# Patient Record
Sex: Female | Born: 1985 | Race: Black or African American | Hispanic: No | Marital: Single | Smoking: Former smoker
Health system: Southern US, Community
[De-identification: ages and names within clinical notes are randomized; demographics above are authoritative.]

## PROBLEM LIST (undated history)

## (undated) ENCOUNTER — Inpatient Hospital Stay (HOSPITAL_COMMUNITY): Payer: Self-pay

## (undated) DIAGNOSIS — D649 Anemia, unspecified: Secondary | ICD-10-CM

## (undated) HISTORY — DX: Anemia, unspecified: D64.9

---
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
## 2006-09-05 ENCOUNTER — Ambulatory Visit: Payer: Self-pay | Admitting: Family Medicine

## 2007-02-01 ENCOUNTER — Inpatient Hospital Stay: Payer: Self-pay | Admitting: Obstetrics and Gynecology

## 2007-03-08 ENCOUNTER — Emergency Department: Payer: Self-pay | Admitting: Emergency Medicine

## 2007-08-08 ENCOUNTER — Emergency Department (HOSPITAL_COMMUNITY): Admission: EM | Admit: 2007-08-08 | Discharge: 2007-08-08 | Payer: Self-pay | Admitting: Emergency Medicine

## 2009-04-07 ENCOUNTER — Emergency Department (HOSPITAL_COMMUNITY): Admission: EM | Admit: 2009-04-07 | Discharge: 2009-04-08 | Payer: Self-pay | Admitting: Emergency Medicine

## 2010-03-04 ENCOUNTER — Inpatient Hospital Stay (HOSPITAL_COMMUNITY): Admission: AD | Admit: 2010-03-04 | Discharge: 2010-03-04 | Payer: Self-pay | Admitting: Obstetrics & Gynecology

## 2010-03-04 ENCOUNTER — Ambulatory Visit: Payer: Self-pay | Admitting: Obstetrics & Gynecology

## 2010-09-03 ENCOUNTER — Inpatient Hospital Stay (HOSPITAL_COMMUNITY)
Admission: AD | Admit: 2010-09-03 | Discharge: 2010-09-03 | Disposition: A | Discharge status: Home / Self Care | Payer: Managed Care, Other (non HMO) | Source: Ambulatory Visit | Attending: Obstetrics and Gynecology | Admitting: Obstetrics and Gynecology

## 2010-09-03 DIAGNOSIS — O479 False labor, unspecified: Secondary | ICD-10-CM | POA: Insufficient documentation

## 2010-09-03 LAB — WET PREP, GENITAL
Clue Cells Wet Prep HPF POC: NONE SEEN
Trich, Wet Prep: NONE SEEN

## 2010-09-06 ENCOUNTER — Encounter (HOSPITAL_COMMUNITY)
Admission: RE | Admit: 2010-09-06 | Discharge: 2010-09-06 | Disposition: A | Discharge status: Home / Self Care | Payer: Managed Care, Other (non HMO) | Source: Ambulatory Visit | Attending: Obstetrics and Gynecology | Admitting: Obstetrics and Gynecology

## 2010-09-06 DIAGNOSIS — Z01812 Encounter for preprocedural laboratory examination: Secondary | ICD-10-CM | POA: Insufficient documentation

## 2010-09-06 LAB — CBC
MCH: 26 pg (ref 26.0–34.0)
MCHC: 32.4 g/dL (ref 30.0–36.0)
MCV: 80.2 fL (ref 78.0–100.0)
RBC: 4.19 MIL/uL (ref 3.87–5.11)
WBC: 5.8 10*3/uL (ref 4.0–10.5)

## 2010-09-06 LAB — RPR: RPR Ser Ql: NONREACTIVE

## 2010-09-13 ENCOUNTER — Inpatient Hospital Stay (HOSPITAL_COMMUNITY)
Admission: RE | Admit: 2010-09-13 | Discharge: 2010-09-16 | DRG: 765 | Disposition: A | Discharge status: Home / Self Care | Payer: Managed Care, Other (non HMO) | Source: Ambulatory Visit | Attending: Obstetrics and Gynecology | Admitting: Obstetrics and Gynecology

## 2010-09-13 ENCOUNTER — Other Ambulatory Visit: Payer: Self-pay | Admitting: Obstetrics and Gynecology

## 2010-09-13 DIAGNOSIS — O9903 Anemia complicating the puerperium: Secondary | ICD-10-CM | POA: Diagnosis not present

## 2010-09-13 DIAGNOSIS — D649 Anemia, unspecified: Secondary | ICD-10-CM | POA: Diagnosis not present

## 2010-09-13 DIAGNOSIS — O34219 Maternal care for unspecified type scar from previous cesarean delivery: Principal | ICD-10-CM | POA: Diagnosis present

## 2010-09-13 DIAGNOSIS — D689 Coagulation defect, unspecified: Secondary | ICD-10-CM | POA: Diagnosis not present

## 2010-09-13 DIAGNOSIS — D696 Thrombocytopenia, unspecified: Secondary | ICD-10-CM | POA: Diagnosis not present

## 2010-09-13 LAB — ABO/RH: ABO/RH(D): O POS

## 2010-09-13 LAB — TYPE AND SCREEN: ABO/RH(D): O POS

## 2010-09-14 LAB — CBC
HCT: 29.4 % — ABNORMAL LOW (ref 36.0–46.0)
Hemoglobin: 9.6 g/dL — ABNORMAL LOW (ref 12.0–15.0)
MCH: 26.4 pg (ref 26.0–34.0)
MCV: 80.8 fL (ref 78.0–100.0)
WBC: 6.4 10*3/uL (ref 4.0–10.5)

## 2010-09-15 LAB — CBC
Hemoglobin: 9 g/dL — ABNORMAL LOW (ref 12.0–15.0)
MCHC: 31.7 g/dL (ref 30.0–36.0)
Platelets: 133 10*3/uL — ABNORMAL LOW (ref 150–400)
RBC: 3.48 MIL/uL — ABNORMAL LOW (ref 3.87–5.11)

## 2010-09-15 LAB — DIFFERENTIAL
Basophils Absolute: 0 10*3/uL (ref 0.0–0.1)
Eosinophils Absolute: 0.1 10*3/uL (ref 0.0–0.7)
Eosinophils Relative: 1 % (ref 0–5)
Lymphs Abs: 1.6 10*3/uL (ref 0.7–4.0)
Monocytes Absolute: 0.4 10*3/uL (ref 0.1–1.0)
Monocytes Relative: 7 % (ref 3–12)

## 2010-09-15 NOTE — Op Note (Signed)
NAMEBLANCH, STANG NO.:  [id number]  MEDICAL RECORD NO.:  [id number]           PATIENT TYPE:  I  LOCATION:  9106                          [facility]ACILITY:  [facility]  PHYSICIAN:  Osborn Coho, M.D.   DATE OF BIRTH:  1986-03-29  DATE OF PROCEDURE:  09/13/2010 DATE OF DISCHARGE:                              OPERATIVE REPORT   PREOPERATIVE DIAGNOSES: 1. A 39-2/7th weeks. 2. Repeat cesarean section.  POSTOPERATIVE DIAGNOSES: 1. A 39-2/7th weeks. 2. Repeat cesarean section.  PROCEDURE:  Repeat C-section.  ATTENDING PHYSICIAN:  Osborn Coho, MD  ASSISTANT:  Denny Levy, CNM  ANESTHESIA:  Spinal.  FINDINGS:  Live female infant with Apgars of 9 at 1 minute and 9 at 5 minutes, weighing 6 pounds and 13 ounces.  SPECIMEN TO PATHOLOGY:  Placenta.  FLUIDS:  2300 mL.  URINE OUTPUT:  150 mL.  EBL:  700 mL.  COMPLICATIONS:  None.  PROCEDURE:  The patient was taken to the operating room after the risks, benefits and alternatives were discussed with the patient.  The patient verbalized understanding and consent signed and witnessed.  The patient was given a spinal per Anesthesia and prepped and draped in the normal sterile fashion in a supine position with a leftward tilt.  A Pfannenstiel skin incision was made and carried down to the underlying layer of fascia with the scalpel and the Bovie.  The fascia was excised bilaterally in the midline and extended bilaterally with the Mayo scissors.  Kocher clamps were placed on the inferior aspect of the fascial incision and the rectus muscle was excised from the fascia.  The same was done on the superior aspect of the fascial incision.  The muscle was separated in the midline and the peritoneum entered incidentally at the time of removing the rectus muscle superiorly from the fascia.  The peritoneum was extended manually and the Alexis self retaining retractor was placed.  The bladder flap was created and a very thin  lower uterine segment was noted.  The uterine incision was made and extended bilaterally with the bandage scissors.  The membranes were ruptured and copious clear fluid was noted and the infant was delivered in the vertex presentation, somewhat compound with the right hand presenting as well and a loose nuchal cord.  After the cord was clamped and cut, the infant was handed to the waiting pediatricians.  The placenta was removed via fundal massage and the uterus was cleared of all clots and debris.  The uterine incision was repaired with 0 Vicryl via a running interlocking stitch and a second imbricating layer was performed.  Bilateral ovaries and fallopian tubes were appeared to be within normal limits and intra-abdominal cavity was copiously irrigated. The Alexis retractor was removed and the peritoneum was repaired with 2- 0 chromic via running stitch.  The fascia was repaired with 0 Vicryl via a running stitch.  The subcutaneous tissue was irrigated and needle made hemostatic with the Bovie and reapproximated using three interrupted stitches of 2-0 plain.  The skin was reapproximated using 3-0 Monocryl via a subcuticular stitch and 0.5-inch Steri-Strips with Benzoin was applied.  A pressure dressing was applied as well.  Sponge, lap and needle count was correct.  The patient tolerated the procedure well and was awaiting transfer to the recovery room in good condition.     Osborn Coho, M.D.     AR/MEDQ  D:  09/13/2010  T:  09/13/2010  Job:  [id number]  Electronically Signed by Osborn Coho M.D. on 09/15/2010 09:33:21 AM

## 2010-09-17 NOTE — Discharge Summary (Addendum)
NAMEBRIDGETTE, Macias NO.:  [id number]  MEDICAL RECORD NO.:  [id number]           PATIENT TYPE:  LOCATION:                                 FACILITY:  PHYSICIAN:  Hal Morales, M.D.DATE OF BIRTH:  Aug 14, 1985  DATE OF ADMISSION:  09/13/2010 DATE OF DISCHARGE:  09/16/2010                              DISCHARGE SUMMARY   ADMITTING DIAGNOSES: 1. Intrauterine pregnancy at 39-2/7 weeks. 2. Previous cesarean section with desire for repeat. 3. Group B strep positive. 4. Methicillin-resistant Staphylococcus aureus screen positive  DISCHARGE DIAGNOSES: 1. 38-2/7 weeks. 2. Repeat cesarean section. 3. Swelling of the parotid glands  PROCEDURES: 1. Repeat low transverse cesarean section. 2. Spinal anesthesia.  HOSPITAL COURSE:  Cindy Macias is a 25 year old gravida 3, para 2-0-0-2 at 39-2/7 weeks who presented on the day of admission on February 15 for scheduled repeat cesarean section.  Her pregnancy had been remarkable for: 1. Previous C-section x2. 2. Anemia. 3. Chlamydia first trimester. 4. History of postpartum depression. 5. __________. 6. History of previous sexually transmitted infections. 7. Second trimester urinary tract infection. 8. Rubella 9. The patient is a twin.  The patient was taken to the operating room where Dr. Su Hilt performed a repeat low transverse cesarean section.  Findings were a viable female, weight 6 pounds 13 ounces, Apgar's were 9 and 9.  Infant was taken to the full-term nursery, the patient was taken to recovery in good condition.  By postop day #1, the patient was overall doing well. However, she had sudden onset of swelling of her parotid glands around 2:30 p.m.  Her hemoglobin that day had been 9.6 down from 10.9, white blood cell count was 6.4, platelet count was 128 down from 144.  She had a little bit of tympanic distention of her abdomen, but had bowel sounds in all quadrants.  Lochia was scant.  Fundus was firm.   Extremities were within normal limits.  Orthostatics were stable.  She had already been placed on iron.  She had some Percocet and Benadryl prior to the onset of these symptoms, but she had Percocet before.  She had no shortness of breath, fever, chest pain, respiratory symptoms, or difficulty swallowing, and her O2 sat was normal.  The bilateral swelling was noted to be equal on both sides.  The patient did have a history of receiving an MMR vaccine in the past.  Her throat was clear.  Dr. Normand Sloop was consulted.  We  continued with Benadryl and had an EpiPen at the bedside.  Dr. Normand Sloop consulted Dr. Suszanne Conners as an ENT.  He recommended that IV hydration be continued, massage of the parotid gland be implemented, and the patient was to supplement with cough drops.  He felt that it was related to dehydration and blockage of the parotid gland versus an infectious process.  Over the next 2 days, the patient remained stable, although she still had the bilateral parotid swelling.  Vital signs remained stable.  Hemoglobin was repeated on February 17 with result of 9.0, normal neutrophil count, white blood cell count was 5.7, and platelet count was 133,000.  She was  up ad lib without difficulty.  She was breast-feeding.  Dr. Pennie Rushing saw the patient on February 17.  By February 18, the patient was doing well, she felt that parotid swelling was less, did seem to be approximately one third reduced on visual exam by this midwife.  She was undecided about birth control.  Infant had been under phototherapy since day 1, but bilirubin was coming down.  The patient's vital signs were stable.  Her incision was clean, dry, and intact.  She was deemed to receive full benefit of hospital stay and was discharged home in stable condition.  Discharge instructions per [facility] handout.  DISCHARGE MEDICATIONS:  Motrin 600 mg p.o. q.6 h. p.r.n. pain, Percocet 5/325, 1-2 p.o. q.24 hours p.r.n.  pain.  Discharge followup will occur at [facility] in 6 weeks or p.r.n.  The office will also schedule followup appointment with ENT, Dr. Suszanne Conners within the next week.     Cindy Macias, C.N.M.   ______________________________ Hal Morales, M.D.    VLL/MEDQ  D:  09/16/2010  T:  09/16/2010  Job:  [id number]  Electronically Signed by Nigel Bridgeman C.N.M. on 10/10/2010 04:52:06 PM Electronically Signed by Dierdre Forth M.D. on 10/14/2010 07:19:15 PM

## 2010-09-19 ENCOUNTER — Inpatient Hospital Stay (HOSPITAL_COMMUNITY)
Admission: AD | Admit: 2010-09-19 | Discharge: 2010-09-23 | DRG: 776 | Disposition: A | Discharge status: Home / Self Care | Payer: Managed Care, Other (non HMO) | Source: Ambulatory Visit | Attending: Obstetrics and Gynecology | Admitting: Obstetrics and Gynecology

## 2010-09-19 DIAGNOSIS — O135 Gestational [pregnancy-induced] hypertension without significant proteinuria, complicating the puerperium: Principal | ICD-10-CM | POA: Diagnosis present

## 2010-09-19 LAB — COMPREHENSIVE METABOLIC PANEL
AST: 111 U/L — ABNORMAL HIGH (ref 0–37)
Albumin: 2.7 g/dL — ABNORMAL LOW (ref 3.5–5.2)
CO2: 24 mEq/L (ref 19–32)
Calcium: 8.8 mg/dL (ref 8.4–10.5)
Chloride: 107 mEq/L (ref 96–112)
Creatinine, Ser: 1.13 mg/dL (ref 0.4–1.2)
GFR calc Af Amer: >60 mL/min (ref >60)
Glucose, Bld: 100 mg/dL — ABNORMAL HIGH (ref 70–99)
Potassium: 3.4 mEq/L — ABNORMAL LOW (ref 3.5–5.1)
Sodium: 138 mEq/L (ref 135–145)
Total Protein: 6.2 g/dL (ref 6.0–8.3)

## 2010-09-19 LAB — URINE MICROSCOPIC-ADD ON

## 2010-09-19 LAB — URINALYSIS, ROUTINE W REFLEX MICROSCOPIC
Bilirubin Urine: NEGATIVE
Nitrite: NEGATIVE
Protein, ur: NEGATIVE mg/dL
Urobilinogen, UA: 0.2 mg/dL (ref 0.0–1.0)
pH: 6 (ref 5.0–8.0)

## 2010-09-19 LAB — LACTATE DEHYDROGENASE: LDH: 274 U/L — ABNORMAL HIGH (ref 94–250)

## 2010-09-19 LAB — CBC
Hemoglobin: 9.9 g/dL — ABNORMAL LOW (ref 12.0–15.0)
MCV: 80.8 fL (ref 78.0–100.0)
Platelets: 190 10*3/uL (ref 150–400)

## 2010-09-20 LAB — CBC
MCH: 26 pg (ref 26.0–34.0)
MCV: 80.9 fL (ref 78.0–100.0)
Platelets: 176 10*3/uL (ref 150–400)
RBC: 3.62 MIL/uL — ABNORMAL LOW (ref 3.87–5.11)
RDW: 14.8 % (ref 11.5–15.5)

## 2010-09-20 LAB — COMPREHENSIVE METABOLIC PANEL
AST: 76 U/L — ABNORMAL HIGH (ref 0–37)
Albumin: 2.6 g/dL — ABNORMAL LOW (ref 3.5–5.2)
BUN: 9 mg/dL (ref 6–23)
Calcium: 8.5 mg/dL (ref 8.4–10.5)
Creatinine, Ser: 1.03 mg/dL (ref 0.4–1.2)
GFR calc Af Amer: >60 mL/min (ref >60)
Total Protein: 5.5 g/dL — ABNORMAL LOW (ref 6.0–8.3)

## 2010-09-20 LAB — URIC ACID: Uric Acid, Serum: 9.4 mg/dL — ABNORMAL HIGH (ref 2.4–7.0)

## 2010-09-21 LAB — COMPREHENSIVE METABOLIC PANEL
ALT: 101 U/L — ABNORMAL HIGH (ref 0–35)
AST: 40 U/L — ABNORMAL HIGH (ref 0–37)
CO2: 25 mEq/L (ref 19–32)
Chloride: 101 mEq/L (ref 96–112)
Creatinine, Ser: 0.91 mg/dL (ref 0.4–1.2)
GFR calc Af Amer: >60 mL/min (ref >60)
GFR calc non Af Amer: >60 mL/min (ref >60)
Glucose, Bld: 91 mg/dL (ref 70–99)
Total Bilirubin: 0.4 mg/dL (ref 0.3–1.2)

## 2010-09-21 LAB — URINE CULTURE
Colony Count: 85000
Culture  Setup Time: 201202220148

## 2010-09-21 LAB — DIFFERENTIAL
Basophils Relative: 0 % (ref 0–1)
Lymphocytes Relative: 26 % (ref 12–46)
Lymphs Abs: 1.3 10*3/uL (ref 0.7–4.0)
Monocytes Absolute: 0.5 10*3/uL (ref 0.1–1.0)
Monocytes Relative: 10 % (ref 3–12)
Neutro Abs: 3.2 10*3/uL (ref 1.7–7.7)
Neutrophils Relative %: 62 % (ref 43–77)

## 2010-09-21 LAB — CREATININE CLEARANCE, URINE, 24 HOUR
Collection Interval-CRCL: 24 hours
Creatinine, 24H Ur: 1830 mg/d — ABNORMAL HIGH (ref 700–1800)
Creatinine, Urine: 30.5 mg/dL
Creatinine: 1.03 mg/dL (ref 0.4–1.2)
Urine Total Volume-CRCL: 6000 mL

## 2010-09-21 LAB — CBC
HCT: 31.7 % — ABNORMAL LOW (ref 36.0–46.0)
Hemoglobin: 10.1 g/dL — ABNORMAL LOW (ref 12.0–15.0)
MCH: 26 pg (ref 26.0–34.0)
RBC: 3.89 MIL/uL (ref 3.87–5.11)

## 2010-09-21 LAB — LACTATE DEHYDROGENASE: LDH: 244 U/L (ref 94–250)

## 2010-09-21 LAB — PROTEIN, URINE, 24 HOUR: Collection Interval-UPROT: 24 hours

## 2010-09-22 LAB — COMPREHENSIVE METABOLIC PANEL
AST: 27 U/L (ref 0–37)
Albumin: 2.7 g/dL — ABNORMAL LOW (ref 3.5–5.2)
Alkaline Phosphatase: 88 U/L (ref 39–117)
Chloride: 105 mEq/L (ref 96–112)
Creatinine, Ser: 0.96 mg/dL (ref 0.4–1.2)
GFR calc Af Amer: >60 mL/min (ref >60)
Potassium: 3.8 mEq/L (ref 3.5–5.1)
Sodium: 136 mEq/L (ref 135–145)
Total Bilirubin: 0.4 mg/dL (ref 0.3–1.2)

## 2010-09-22 LAB — DIFFERENTIAL
Basophils Absolute: 0 10*3/uL (ref 0.0–0.1)
Basophils Relative: 1 % (ref 0–1)
Eosinophils Absolute: 0.1 10*3/uL (ref 0.0–0.7)
Neutrophils Relative %: 56 % (ref 43–77)

## 2010-09-22 LAB — CBC
Platelets: 201 10*3/uL (ref 150–400)
RBC: 3.84 MIL/uL — ABNORMAL LOW (ref 3.87–5.11)
WBC: 4.1 10*3/uL (ref 4.0–10.5)

## 2010-09-22 LAB — URIC ACID: Uric Acid, Serum: 7.8 mg/dL — ABNORMAL HIGH (ref 2.4–7.0)

## 2010-09-22 LAB — HEPATITIS B SURFACE ANTIGEN: Hepatitis B Surface Ag: NEGATIVE

## 2010-09-23 LAB — CBC
HCT: 31 % — ABNORMAL LOW (ref 36.0–46.0)
Hemoglobin: 9.6 g/dL — ABNORMAL LOW (ref 12.0–15.0)
MCHC: 31 g/dL (ref 30.0–36.0)

## 2010-09-23 LAB — COMPREHENSIVE METABOLIC PANEL
ALT: 58 U/L — ABNORMAL HIGH (ref 0–35)
Alkaline Phosphatase: 82 U/L (ref 39–117)
CO2: 24 mEq/L (ref 19–32)
Chloride: 107 mEq/L (ref 96–112)
GFR calc non Af Amer: >60 mL/min (ref >60)
Glucose, Bld: 88 mg/dL (ref 70–99)
Potassium: 3.8 mEq/L (ref 3.5–5.1)
Sodium: 137 mEq/L (ref 135–145)
Total Bilirubin: 0.1 mg/dL — ABNORMAL LOW (ref 0.3–1.2)

## 2010-09-23 LAB — DIFFERENTIAL
Basophils Absolute: 0 10*3/uL (ref 0.0–0.1)
Lymphocytes Relative: 38 % (ref 12–46)
Monocytes Absolute: 0.4 10*3/uL (ref 0.1–1.0)
Neutro Abs: 2.2 10*3/uL (ref 1.7–7.7)

## 2010-09-23 LAB — LACTATE DEHYDROGENASE: LDH: 186 U/L (ref 94–250)

## 2010-09-23 LAB — MAGNESIUM: Magnesium: 1.8 mg/dL (ref 1.5–2.5)

## 2010-09-25 LAB — HEPATITIS C VRS RNA DETECT BY PCR-QUAL: Hepatitis C Vrs RNA by PCR-Qual: NEGATIVE

## 2010-10-13 LAB — URINALYSIS, ROUTINE W REFLEX MICROSCOPIC
Glucose, UA: NEGATIVE mg/dL
Hgb urine dipstick: NEGATIVE
Protein, ur: NEGATIVE mg/dL
pH: 5.5 (ref 5.0–8.0)

## 2010-10-13 LAB — CBC
HCT: 34.6 % — ABNORMAL LOW (ref 36.0–46.0)
Hemoglobin: 11.6 g/dL — ABNORMAL LOW (ref 12.0–15.0)
MCHC: 33.5 g/dL (ref 30.0–36.0)
RBC: 4.11 MIL/uL (ref 3.87–5.11)

## 2010-10-13 LAB — GC/CHLAMYDIA PROBE AMP, GENITAL
Chlamydia, DNA Probe: POSITIVE — AB
GC Probe Amp, Genital: NEGATIVE

## 2010-10-13 LAB — WET PREP, GENITAL

## 2010-10-26 NOTE — H&P (Signed)
  NAME:  Cindy Macias, ALIOTO NO.:  [id number]  MEDICAL RECORD NO.:  [id number]           PATIENT TYPE:  I  LOCATION:  9307                          [facility]  [facility]  PHYSICIAN:  Osborn Coho, M.D.   DATE OF BIRTH:  25-Feb-1986  DATE OF ADMISSION:  09/19/2010 DATE OF DISCHARGE:                             HISTORY & PHYSICAL   The patient is a 25 year old, gravida 2, para 2-0-0-2, 6 days postop low transverse C-section, who presents to maternity admissions after discovering that she had elevated blood pressure at her child's pediatrician's office today.  She also reports not feeling well for the past several days.  She denies epigastric pain, vision changes or headache, but reports nausea and occasional vomiting since delivery. She did not have any issues with elevated blood pressures during the pregnancy or any medium intermediate postpartum period.  She reports that her edema is stable since delivery and reports good urine output.  OBJECTIVE:  VITAL SIGNS:  Blood pressures 130s/60s-80s, afebrile.  Vital signs otherwise stable. HEART:  Regular rate and rhythm. LUNGS:  Clear to auscultation bilaterally. ABDOMEN:  Soft and nontender.  Fundus firm one above ST.  Incision clean, dry and intact. EXTREMITIES:  Edema 2+.  Negative Homans.  Deep tendon reflexes 1+.  No clonus.  LABORATORY DATA:  CBC shows white blood cell count of 5.8, hemoglobin of 9.9, hematocrit 30.7, platelet count of 190, this is up from her postpartum platelet counts in the 130s.  SGOT elevated at 111, SGPT elevated at 162, LDH elevated at 274 and uric acid elevated at 9.8. Clean catch UA is negative for protein.  ASSESSMENT:  Postpartum gestational hypertension versus preeclampsia.  PLAN:  Admit to third floor per consultation with Dr. Su Hilt for a 24- hour urine Foley catheter.  PIH labs in the morning.     Dorathy Kinsman, CNM   ______________________________ Osborn Coho,  M.D.    VS/MEDQ  D:  09/20/2010  T:  09/20/2010  Job:  [id number]  Electronically Signed by Dorathy Kinsman  on 09/24/2010 09:55:05 PM Electronically Signed by Osborn Coho M.D. on 10/26/2010 09:49:33 AM

## 2010-11-01 NOTE — Discharge Summary (Signed)
NAMEPENNELOPE, Cindy Macias NO.:  [id number]  MEDICAL RECORD NO.:  [id number]           PATIENT TYPE:  I  LOCATION:  9307                          [facility]  PHYSICIAN:  Janine Limbo, M.D.DATE OF BIRTH:  [birth year]  DATE OF ADMISSION:  09/19/2010 DATE OF DISCHARGE:  09/23/2010                              DISCHARGE SUMMARY   ADMITTING DIAGNOSES: 1. Six days postpartum, status post primary low transverse cesarean     section for delivery. 2. Postpartum hypertension (rule out preeclampsia). 3. Elevated LFTs and uric acid. 4. Persistent anemia. 5. Lactating.  DISCHARGE DIAGNOSES: 1. Postpartum and postoperative day number 10. 2. LFTs and uric acid and decreasing (ALT and uric acid still slightly     high at the time of discharge). 3. Trichomonas on urinalysis. 4. Lactating. 5. Postpartum hypertension with no evidence of preeclampsia. 6. Persisting anemia.  HOSPITAL PROCEDURES:  AICU admission and 24-hour magnesium sulfate therapy while awaiting 24-hour urine results.  HOSPITAL COURSE:  Ms. Sobiech is a 25 year old para 2-0-0-2 who presented on the date of admission 6 days postop, status post a low transverse cesarean section with chief complaint of noting elevated blood pressures while at her child's pediatrician that day.  She reported not feeling well for few days prior to that.  She denied any epigastric pain, visual changes, or headache on admission; however, she had reported some nausea, and occasional vomiting since delivery.  She did not have any issues with elevated blood pressures either during her pregnancy nor in the immediate postpartum period after her surgery.  She reported that her edema had been stable since her delivery and reported good urinary output.  She had blood pressures initially 130 systolics over diastolics of 60-80s.  No clonus on admission.  Deep tendon reflexes were 1+.  She had negative Homans sign, and she had 2+ edema  bilateral lower extremities.  Fundus was firm.  Incision was clean, dry, and intact.  Physical exam was otherwise within normal limits.  She did have daily PIH labs while hospitalized starting on September 19, 2010, and ending on September 23, 2010.  White count was normal throughout. Hemoglobin on admission was 9.9.  on day of discharge, it was 9.6, platelets were stable throughout.  On admission they were 190 and during the course of her stay they ranged from a low of 176 to 201 but all this within normal limits.  Her urinalysis, she did not have any protein. She had moderate hemoglobin & 15 of ketones, & moderate Luk's and it also did show trichomoniasis.  She did have a MRSA screen which was negative.  The urine culture for her UA did show 85,000 colonies multiple morphocites including group beta strep, and she also had positive cultures on OB, urine antenatally.  On admission, liver enzymes were elevated.  Her AST was 111, ALT was 162, LDH was 274, and her uric acid was 9.8.  She had reported when she called stating that blood pressure was up at the pediatrician & that her blood pressure there was 164/90.  She was initially admitted to the third floor for the  24-hour urine collection, the Foley catheter and on the morning of September 20, 2010, just before lunch time Dr. Normand Sloop did transfer the patient to AICU to start magnesium infusion while awaiting her 24-hour urine result.  On the morning of September 20, 2010, the patient was without complaints.  She was doing well.  She was pumping, father of the baby did have the infant at bedside.  Her blood pressure range that day was 139-149/91 to 95.  Physical exam was within normal limits.  Her liver enzymes on September 20, 2010, AST had come down to 76, ALT was down to 132.  LDH was down slightly to 265.  Uric acid was coming down and it was 9.4.  The magnesium sulfate was discontinued on September 21, 2010, around lunch time.  Her 24-hour  urine did come back within normal limits.  Total urinary protein for the 24 hours was 180.  Her creatinine clearance was just slightly high above normal at 123.  Her calcium was slightly low while she is on the magnesium, it got to low of 7.6 and on the date of discharge it had come back up to within normal limits at 8.7.  She was transferred to the floor on the afternoon of September 21, 2010.  Labs and blood pressures continued to stabilize.  Around 3:30 p.m. on September 21, 2010, her blood pressure was 134/87.  She did have adequate urinary output.  Secondary to the increased liver enzymes Dr. Pennie Rushing did order for her to have both hepatitis A, B, and C antibodies drawn.  Those were all negative, and those were done on September 21, 2010.  After being transferred to the floor, on September 22, 2010, her blood pressures had started trending upward since off the magnesium, highest noted on the morning of a September 22, 2010, was 157/95 the range since on the floor had been 131-157 over diastolics of 87-96. Later on September 22, 2010, Dr. Stefano Gaul did evaluate the patient and did start the patient on labetalol 200 mg p.o. b.i.d.  They continued checking her PIH labs as I mentioned daily.  On September 21, 2010, AST had gone down to 40, ALT 101, LDH 244.  Uric acid down to 8.5.  On the morning of September 23, 2010, AST was back to normal equal to 21, ALT was still slightly high, it was equal to 58.  LDH was normal at 186. Uric acid was to still slightly high at 7.8.  On the morning of September 23, 2010, blood pressures ranged 115-139 over diastolics of 57-84.  The patient was deemed to have received full benefit of her hospital stay and was discharged home in stable condition by Dr. Stefano Gaul.  On September 23, 2010, which would have been postoperative day number 10.  DISCHARGE MEDICATIONS: 1. Ibuprofen 800 mg one tablet p.o. q.8 h p.r.n. pain. 2. Percocet 5/325  one tablet p.o. q.3 h or 2  tablets p.o. q.6 h     p.r.n. moderate to severe pain. 3. Prenatal vitamin 1 tablet p.o. daily. 4. Labetalol 200 mg one tablet p.o. b.i.d.  DISCHARGE FOLLOWUP:  To my knowledge, the patient was to either have a 1- week followup at the office or have a Smart Start blood pressure check in 1 week, and the patient was given PIH precautions and continued postoperative precautions as per her discharge instructions when she had been discharged previously.     Candice Denny Levy, CNM   ______________________________ Janine Limbo, M.D.  CHS/MEDQ  D:  10/18/2010  T:  10/19/2010  Job:  [id number]  Electronically Signed by Carolanne Grumbling CNM on 10/19/2010 11:11:56 AM Electronically Signed by Kirkland Hun M.D. on 11/01/2010 11:03:48 AM

## 2010-11-03 LAB — RAPID STREP SCREEN (MED CTR MEBANE ONLY): Streptococcus, Group A Screen (Direct): NEGATIVE

## 2010-12-15 NOTE — Discharge Summary (Signed)
NAME:  Cindy Macias, Cindy Macias                         ACCOUNT NO.:  [id number]   MEDICAL RECORD NO.:  [id number]                   PATIENT TYPE:  INP   LOCATION:  9124                                 [facility]ILITY:  [facility]   PHYSICIAN:  Ginger Carne, MD               DATE OF BIRTH:  Jun 26, 1986   DATE OF ADMISSION:  02/24/2004  DATE OF DISCHARGE:  02/27/2004                                 DISCHARGE SUMMARY   DISCHARGE DIAGNOSES:  1. Delivery of a single female infant via low transverse Cesarean section     for decreased fetal heart tones.  2. Postpartum anemia.   DISCHARGE MEDICATIONS:  1. Ibuprofen 600 mg q.6h. p.r.n. pain.  2. Percocet 5/325 mg one p.o. q.4h. for severe pain.  3. Depo-Medrol 100 mg IM times one; will need a repeat injection at three     months.  4. Prenatal vitamins one p.o. daily.   WOUND CARE, DIET, AND ACTIVITY:  Regular and per instruction booklet.   FOLLOWUP CARE:  At [facility] in six weeks. She is also to return to [facility]  on Tuesday, February 29, 2004, for staple removal.   HOSPITAL COURSE:  [name] is a 25 year old young female who came in in  labor at 39-6/7 weeks. Her membranes were intact at that time. She was noted  to have decreased fetal heart tones down to 30 to 50 beats per minute, after  artificial rupture of membranes at which point there was clear fluid seen.  The patient was sent for Cesarean section. The patient did fairly well  postoperatively, though she did have some complaints of nausea and vomiting  which appeared to have mostly resolved by the time of discharge. On the day  of discharge, February 27, 2004, the patient was doing well, ambulating well,  eating well. She had not had a bowel movement yet, but was passing flatus.  On exam she had a regular rate and rhythm, and heart with no murmurs. Her  abdomen was soft and benign with a fundus below the umbilicus. She was still  somewhat tender. The incision looked clean, dry, intact, with no  drainage,  and appeared to be healing well. Her chest was clear to auscultation with no  crackles. Extremities show no evidence of edema. In summary, this is a 8-  year-old, G1, P1, who was discharged on postoperative day three after a low  transverse Cesarean section with decreased fetal heart tones after delivery  of a female child. She received Depo-Provera at discharge for birth control  and was breast and bottle feeding. She did have a hemoglobin postpartum of  9.2 and was told to continue on her prenatal vitamins. She was rubella  immune and did not need that updated, and social work was very involved with  this patient and her family.     Nani Gasser, M.D.  Ginger Carne, MD    CM/MEDQ  D:  02/27/2004  T:  02/27/2004  Job:  [id number]

## 2010-12-15 NOTE — Op Note (Signed)
NAME:  FLAVIA, BRUSS                         ACCOUNT NO.:  [id number]   MEDICAL RECORD NO.:  [id number]                   PATIENT TYPE:  INP   LOCATION:  9170                                 [facility]   PHYSICIAN:  Ginger Carne, MD               DATE OF BIRTH:  Jan 29, 1986   DATE OF PROCEDURE:  DATE OF DISCHARGE:                                 OPERATIVE REPORT   PREOPERATIVE DIAGNOSES:  Nonreassuring fetal heart rate, term pregnancy.   POSTOPERATIVE DIAGNOSES:  Nonreassuring fetal heart rate, term pregnancy.  Viable delivery of female infant.   PROCEDURE:  Primary low transverse cesarean section.   ANESTHESIA:  General.   COMPLICATIONS:  None immediate.   SURGEON:  Ginger Carne, MD   ASSISTANT:  Huel Cote, M.D.   SPECIMENS:  Placenta.   ESTIMATED BLOOD LOSS:  600-700 mL.   FINDINGS:  Term infant female delivered with Apgar's of 9 & 9 weighing 6  pounds 9 ounces. The uterus, tubes and ovaries showed normal residual  changes of pregnancy, amniotic fluid was clear, no evidence of nuchal cord  or other abnormalities of the cord was noted. The fetus was in a vertex  presentation.   DESCRIPTION OF PROCEDURE:  The patient prepped and draped in the usual  fashion and placed in the left lateral supine position.  Betadine solution  using for antiseptic and the patient was catheterized prior to the  procedure.  After adequate general anesthesia, a vertical infraumbilical  incision was made and the abdomen opened, lower uterine segment incised  transversely, baby delivered, cord clamped and cut and infant given to the  pediatric staff after bulb suctioning. Placenta delivered manually, uterus  inspected, closed the uterine musculature in one layer with #0 Vicryl  running interlocking suture, 2-0 Vicryl running for the parietoperitoneum,  #0 Vicryl running for the fascia, 3-0 Vicryl for the subcutaneous layer and  skin staples for the skin.  Instrument and  sponge counts were correct. The  patient tolerated the procedure well and returned to post anesthesia  recovery room in excellent condition.                                               Ginger Carne, MD    SHB/MEDQ  D:  02/24/2004  T:  02/24/2004  Job:  [id number]

## 2011-04-19 LAB — URINE CULTURE: Colony Count: 40000

## 2011-04-19 LAB — URINE MICROSCOPIC-ADD ON

## 2011-04-19 LAB — URINALYSIS, ROUTINE W REFLEX MICROSCOPIC
Bilirubin Urine: NEGATIVE
Nitrite: NEGATIVE
Specific Gravity, Urine: 1.022
Urobilinogen, UA: 0.2
pH: 6

## 2011-05-28 ENCOUNTER — Emergency Department (HOSPITAL_COMMUNITY): Payer: Self-pay

## 2011-05-28 ENCOUNTER — Emergency Department (HOSPITAL_COMMUNITY)
Admission: EM | Admit: 2011-05-28 | Discharge: 2011-05-29 | Disposition: A | Discharge status: Home / Self Care | Payer: Self-pay | Attending: Emergency Medicine | Admitting: Emergency Medicine

## 2011-05-28 DIAGNOSIS — N390 Urinary tract infection, site not specified: Secondary | ICD-10-CM | POA: Insufficient documentation

## 2011-05-28 DIAGNOSIS — R109 Unspecified abdominal pain: Secondary | ICD-10-CM | POA: Insufficient documentation

## 2011-05-28 DIAGNOSIS — R112 Nausea with vomiting, unspecified: Secondary | ICD-10-CM | POA: Insufficient documentation

## 2011-05-28 DIAGNOSIS — O2390 Unspecified genitourinary tract infection in pregnancy, unspecified trimester: Secondary | ICD-10-CM | POA: Insufficient documentation

## 2011-05-28 DIAGNOSIS — O9989 Other specified diseases and conditions complicating pregnancy, childbirth and the puerperium: Secondary | ICD-10-CM | POA: Insufficient documentation

## 2011-05-28 LAB — BASIC METABOLIC PANEL
BUN: 8 mg/dL (ref 6–23)
CO2: 24 mEq/L (ref 19–32)
Glucose, Bld: 89 mg/dL (ref 70–99)
Potassium: 3.6 mEq/L (ref 3.5–5.1)
Sodium: 133 mEq/L — ABNORMAL LOW (ref 135–145)

## 2011-05-28 LAB — CBC
HCT: 34.4 % — ABNORMAL LOW (ref 36.0–46.0)
Hemoglobin: 11.5 g/dL — ABNORMAL LOW (ref 12.0–15.0)
MCH: 26 pg (ref 26.0–34.0)
MCHC: 33.4 g/dL (ref 30.0–36.0)
RBC: 4.42 MIL/uL (ref 3.87–5.11)

## 2011-05-28 LAB — URINALYSIS, ROUTINE W REFLEX MICROSCOPIC
Bilirubin Urine: NEGATIVE
Ketones, ur: NEGATIVE mg/dL
Nitrite: POSITIVE — AB
Protein, ur: NEGATIVE mg/dL

## 2011-05-28 LAB — DIFFERENTIAL
Basophils Absolute: 0 10*3/uL (ref 0.0–0.1)
Basophils Relative: 0 % (ref 0–1)
Lymphocytes Relative: 31 % (ref 12–46)
Monocytes Absolute: 0.5 10*3/uL (ref 0.1–1.0)
Monocytes Relative: 8 % (ref 3–12)
Neutro Abs: 3.2 10*3/uL (ref 1.7–7.7)
Neutrophils Relative %: 60 % (ref 43–77)

## 2011-05-28 LAB — POCT PREGNANCY, URINE: Preg Test, Ur: POSITIVE

## 2011-05-28 LAB — WET PREP, GENITAL
Clue Cells Wet Prep HPF POC: NEGATIVE — AB
Trich, Wet Prep: NEGATIVE — AB
Yeast Wet Prep HPF POC: NEGATIVE — AB

## 2011-05-28 LAB — URINE MICROSCOPIC-ADD ON

## 2011-05-29 LAB — GC/CHLAMYDIA PROBE AMP, GENITAL
Chlamydia, DNA Probe: NEGATIVE
GC Probe Amp, Genital: NEGATIVE

## 2012-03-31 ENCOUNTER — Emergency Department (HOSPITAL_COMMUNITY)
Admission: EM | Admit: 2012-03-31 | Discharge: 2012-03-31 | Disposition: A | Discharge status: Home / Self Care | Payer: Self-pay | Attending: Emergency Medicine | Admitting: Emergency Medicine

## 2012-03-31 ENCOUNTER — Encounter (HOSPITAL_COMMUNITY): Payer: Self-pay | Admitting: Family Medicine

## 2012-03-31 DIAGNOSIS — N390 Urinary tract infection, site not specified: Secondary | ICD-10-CM | POA: Insufficient documentation

## 2012-03-31 DIAGNOSIS — O210 Mild hyperemesis gravidarum: Secondary | ICD-10-CM | POA: Insufficient documentation

## 2012-03-31 DIAGNOSIS — F17200 Nicotine dependence, unspecified, uncomplicated: Secondary | ICD-10-CM | POA: Insufficient documentation

## 2012-03-31 DIAGNOSIS — O219 Vomiting of pregnancy, unspecified: Secondary | ICD-10-CM

## 2012-03-31 DIAGNOSIS — Z3490 Encounter for supervision of normal pregnancy, unspecified, unspecified trimester: Secondary | ICD-10-CM

## 2012-03-31 DIAGNOSIS — O2390 Unspecified genitourinary tract infection in pregnancy, unspecified trimester: Secondary | ICD-10-CM | POA: Insufficient documentation

## 2012-03-31 LAB — COMPREHENSIVE METABOLIC PANEL
AST: 13 U/L (ref 0–37)
CO2: 22 mEq/L (ref 19–32)
Calcium: 9.5 mg/dL (ref 8.4–10.5)
Creatinine, Ser: 0.78 mg/dL (ref 0.50–1.10)
GFR calc Af Amer: >90 mL/min (ref >90)
GFR calc non Af Amer: >90 mL/min (ref >90)

## 2012-03-31 LAB — CBC WITH DIFFERENTIAL/PLATELET
Basophils Absolute: 0 10*3/uL (ref 0.0–0.1)
Basophils Relative: 0 % (ref 0–1)
Eosinophils Absolute: 0.1 10*3/uL (ref 0.0–0.7)
Eosinophils Relative: 2 % (ref 0–5)
HCT: 36.3 % (ref 36.0–46.0)
Hemoglobin: 12.1 g/dL (ref 12.0–15.0)
Lymphocytes Relative: 24 % (ref 12–46)
Lymphs Abs: 1.6 10*3/uL (ref 0.7–4.0)
MCH: 26.6 pg (ref 26.0–34.0)
MCHC: 33.3 g/dL (ref 30.0–36.0)
MCV: 79.8 fL (ref 78.0–100.0)
Monocytes Absolute: 0.5 10*3/uL (ref 0.1–1.0)
Monocytes Relative: 7 % (ref 3–12)
Neutro Abs: 4.3 10*3/uL (ref 1.7–7.7)
Neutrophils Relative %: 67 % (ref 43–77)
Platelets: 159 10*3/uL (ref 150–400)
RBC: 4.55 MIL/uL (ref 3.87–5.11)
RDW: 13.5 % (ref 11.5–15.5)
WBC: 6.4 10*3/uL (ref 4.0–10.5)

## 2012-03-31 LAB — URINALYSIS, ROUTINE W REFLEX MICROSCOPIC
Bilirubin Urine: NEGATIVE
Hgb urine dipstick: NEGATIVE
Ketones, ur: NEGATIVE mg/dL
Nitrite: POSITIVE — AB
Protein, ur: NEGATIVE mg/dL
Urobilinogen, UA: 1 mg/dL (ref 0.0–1.0)

## 2012-03-31 LAB — POCT PREGNANCY, URINE: Preg Test, Ur: POSITIVE — AB

## 2012-03-31 LAB — WET PREP, GENITAL: Yeast Wet Prep HPF POC: NONE SEEN

## 2012-03-31 LAB — URINE MICROSCOPIC-ADD ON

## 2012-03-31 MED ORDER — ONDANSETRON HCL 8 MG PO TABS: 8.0000 mg | ORAL_TABLET | Freq: Three times a day (TID) | ORAL | Status: AC | PRN

## 2012-03-31 MED ORDER — ONDANSETRON HCL 8 MG PO TABS: 8.0000 mg | ORAL_TABLET | Freq: Three times a day (TID) | ORAL | Status: DC | PRN

## 2012-03-31 MED ORDER — ONDANSETRON 4 MG PO TBDP
8.0000 mg | ORAL_TABLET | Freq: Once | ORAL | Status: AC
  Administered 2012-03-31: 8 mg via ORAL
  Filled 2012-03-31: qty 2

## 2012-03-31 MED ORDER — CEPHALEXIN 250 MG PO CAPS: 250.0000 mg | ORAL_CAPSULE | Freq: Four times a day (QID) | ORAL | Status: DC

## 2012-03-31 MED ORDER — CEPHALEXIN 250 MG PO CAPS: 250.0000 mg | ORAL_CAPSULE | Freq: Four times a day (QID) | ORAL | Status: AC

## 2012-03-31 NOTE — ED Notes (Signed)
Advised of the wait time 

## 2012-03-31 NOTE — ED Provider Notes (Signed)
History     CSN: [id number]  Arrival date & time 03/31/12  1429   None     Chief Complaint  Patient presents with  . Nausea  . Emesis  . Constipation  . Dizziness    (Consider location/radiation/quality/duration/timing/severity/associated sxs/prior treatment) HPI History provided by pt.   Pt has had intermittent N/V for the past two weeks.  Most prominent in the evening and vomits approx 2x/d.  Associated w/ constipation.  Usually has 1 BM/d, has not had a BM in 2 days and she has to strain.  Has mild pain in LUQ that seems to be associated w/ eating.  Denies fever, hematemesis/hematochezia/melena and GU sx, w/ exception of missing her last period.  LMP 02/11/12.  Pt has h/o HTN but otherwise healthy and no h/o abd surgeries.  Also c/o hemorrhoid.    History reviewed. No pertinent past medical history.  History reviewed. No pertinent past surgical history.  History reviewed. No pertinent family history.  History  Substance Use Topics  . Smoking status: Current Everyday Smoker  . Smokeless tobacco: Not on file  . Alcohol Use: No    OB History    Grav Para Term Preterm Abortions TAB SAB Ect Mult Living                  Review of Systems  All other systems reviewed and are negative.    Allergies  Review of patient's allergies indicates no known allergies.  Home Medications  No current outpatient prescriptions on file.  BP 132/71  Pulse 76  Temp 98.3 F (36.8 C) (Oral)  Resp 18  SpO2 100%  LMP 02/11/2012  Physical Exam  Nursing note and vitals reviewed. Constitutional: She is oriented to person, place, and time. She appears well-developed and well-nourished. No distress.  HENT:  Head: Normocephalic and atraumatic.  Eyes:       Normal appearance  Neck: Normal range of motion.  Cardiovascular: Normal rate and regular rhythm.   Pulmonary/Chest: Effort normal and breath sounds normal. No respiratory distress.  Abdominal: Soft. Bowel sounds are normal. She  exhibits no distension and no mass. There is no tenderness. There is no rebound and no guarding.       Diffuse, mild ttp, reported to be worst in LUQ.    Genitourinary:       No CVA tenderness.  No external hemorrhoids.   Musculoskeletal: Normal range of motion.  Neurological: She is alert and oriented to person, place, and time.  Skin: Skin is warm and dry. No rash noted.  Psychiatric: She has a normal mood and affect. Her behavior is normal.    ED Course  Procedures (including critical care time)  Labs Reviewed  URINALYSIS, ROUTINE W REFLEX MICROSCOPIC - Abnormal; Notable for the following:    APPearance CLOUDY (*)     Nitrite POSITIVE (*)     Leukocytes, UA SMALL (*)     All other components within normal limits  COMPREHENSIVE METABOLIC PANEL - Abnormal; Notable for the following:    Sodium 133 (*)     Potassium 3.4 (*)     Total Bilirubin 0.2 (*)     All other components within normal limits  POCT PREGNANCY, URINE - Abnormal; Notable for the following:    Preg Test, Ur POSITIVE (*)     All other components within normal limits  URINE MICROSCOPIC-ADD ON - Abnormal; Notable for the following:    Squamous Epithelial / LPF MANY (*)  Bacteria, UA MANY (*)     All other components within normal limits  CBC WITH DIFFERENTIAL  WET PREP, GENITAL  GC/CHLAMYDIA PROBE AMP, GENITAL   No results found.   1. Pregnancy   2. Nausea and vomiting in pregnancy   3. Urinary tract infection       MDM  26yo F w/ h/o HTN presents w/ c/o 2 weeks N/V + constipation and mild, intermittent left upper abd pain.  Urine hcg positive and U/A positive for UTI.  Labs otherwise unremarkable.  On exam, diffuse abd ttp, worst in LUQ, nml genitalia.  Doubt ectopic pregnancy based on location and characteristics of pain.  No active vomiting in ED and pt is tolerating pos.  Pt has an OB/Gyn and has been advised to f/u asap.  D/c'd home w/ zofran and recommended docusate sodium and prenatal vitamin.   Return precautions discussed.         Arie Sabina [place], [place] 03/31/12 2225

## 2012-03-31 NOTE — ED Notes (Signed)
Vitals and wait time discussed

## 2012-03-31 NOTE — ED Notes (Signed)
Per pt 2 weeks of N,V, constipation, light headed and just not feeling wee. LMP 7/15

## 2012-03-31 NOTE — ED Notes (Signed)
Pt states her pain is same, still a "little crampy".  Pt states her weakness and dizziness is increasing.  No emesis in waiting room as of yet.

## 2012-04-01 LAB — GC/CHLAMYDIA PROBE AMP, GENITAL
Chlamydia, DNA Probe: NEGATIVE
GC Probe Amp, Genital: NEGATIVE

## 2012-04-01 NOTE — ED Provider Notes (Signed)
Medical screening examination/treatment/procedure(s) were performed by non-physician practitioner and as supervising physician I was immediately available for consultation/collaboration.   Lyanne Co, MD 04/01/12 [phone]

## 2013-07-27 ENCOUNTER — Emergency Department (HOSPITAL_COMMUNITY)
Admission: EM | Admit: 2013-07-27 | Discharge: 2013-07-28 | Disposition: A | Discharge status: Home / Self Care | Payer: Self-pay | Attending: Emergency Medicine | Admitting: Emergency Medicine

## 2013-07-27 ENCOUNTER — Encounter (HOSPITAL_COMMUNITY): Payer: Self-pay | Admitting: Emergency Medicine

## 2013-07-27 DIAGNOSIS — M545 Low back pain, unspecified: Secondary | ICD-10-CM | POA: Insufficient documentation

## 2013-07-27 DIAGNOSIS — H9209 Otalgia, unspecified ear: Secondary | ICD-10-CM | POA: Insufficient documentation

## 2013-07-27 DIAGNOSIS — J111 Influenza due to unidentified influenza virus with other respiratory manifestations: Secondary | ICD-10-CM | POA: Insufficient documentation

## 2013-07-27 DIAGNOSIS — F17200 Nicotine dependence, unspecified, uncomplicated: Secondary | ICD-10-CM | POA: Insufficient documentation

## 2013-07-27 DIAGNOSIS — D61818 Other pancytopenia: Secondary | ICD-10-CM | POA: Insufficient documentation

## 2013-07-27 DIAGNOSIS — M542 Cervicalgia: Secondary | ICD-10-CM | POA: Insufficient documentation

## 2013-07-27 DIAGNOSIS — R51 Headache: Secondary | ICD-10-CM | POA: Insufficient documentation

## 2013-07-27 DIAGNOSIS — Z3202 Encounter for pregnancy test, result negative: Secondary | ICD-10-CM | POA: Insufficient documentation

## 2013-07-27 DIAGNOSIS — R509 Fever, unspecified: Secondary | ICD-10-CM | POA: Insufficient documentation

## 2013-07-27 LAB — URINALYSIS, ROUTINE W REFLEX MICROSCOPIC
Nitrite: POSITIVE — AB
Specific Gravity, Urine: 1.034 — ABNORMAL HIGH (ref 1.005–1.030)
Urobilinogen, UA: 0.2 mg/dL (ref 0.0–1.0)

## 2013-07-27 LAB — URINE MICROSCOPIC-ADD ON

## 2013-07-27 LAB — PREGNANCY, URINE: Preg Test, Ur: NEGATIVE

## 2013-07-27 NOTE — ED Notes (Signed)
Pt states she has been experiencing pain lower left back area that radiates up through shoulders. Pt presents guarded and moaning. Pain level stated at 10. Pt states she took Aleve PM a couple of days ago with minimal relief.

## 2013-07-27 NOTE — ED Notes (Signed)
Genera;ized body aches,  Back pain  Started about 1 1/2 weeks ago.  Works at [employer] with the [employer]  States her back hurts from the top of her spine down to the bottom.

## 2013-07-28 ENCOUNTER — Emergency Department (HOSPITAL_COMMUNITY): Payer: Self-pay

## 2013-07-28 ENCOUNTER — Encounter (HOSPITAL_COMMUNITY): Payer: Self-pay | Admitting: Emergency Medicine

## 2013-07-28 ENCOUNTER — Emergency Department (HOSPITAL_COMMUNITY)
Admission: EM | Admit: 2013-07-28 | Discharge: 2013-07-29 | Disposition: A | Discharge status: Home / Self Care | Payer: Self-pay | Attending: Emergency Medicine | Admitting: Emergency Medicine

## 2013-07-28 DIAGNOSIS — F17200 Nicotine dependence, unspecified, uncomplicated: Secondary | ICD-10-CM | POA: Insufficient documentation

## 2013-07-28 DIAGNOSIS — J111 Influenza due to unidentified influenza virus with other respiratory manifestations: Secondary | ICD-10-CM | POA: Insufficient documentation

## 2013-07-28 DIAGNOSIS — H53149 Visual discomfort, unspecified: Secondary | ICD-10-CM | POA: Insufficient documentation

## 2013-07-28 LAB — BASIC METABOLIC PANEL
Calcium: 8.6 mg/dL (ref 8.4–10.5)
Creatinine, Ser: 0.86 mg/dL (ref 0.50–1.10)
GFR calc non Af Amer: >90 mL/min (ref >90)
Glucose, Bld: 91 mg/dL (ref 70–99)
Sodium: 135 mEq/L — ABNORMAL LOW (ref 137–147)

## 2013-07-28 LAB — CBC WITH DIFFERENTIAL/PLATELET
Basophils Absolute: 0 10*3/uL (ref 0.0–0.1)
Eosinophils Absolute: 0 10*3/uL (ref 0.0–0.7)
Eosinophils Relative: 1 % (ref 0–5)
Lymphs Abs: 0.4 10*3/uL — ABNORMAL LOW (ref 0.7–4.0)
MCH: 27.5 pg (ref 26.0–34.0)
MCV: 81.4 fL (ref 78.0–100.0)
Monocytes Absolute: 0.6 10*3/uL (ref 0.1–1.0)
Platelets: 128 10*3/uL — ABNORMAL LOW (ref 150–400)
RDW: 13.1 % (ref 11.5–15.5)

## 2013-07-28 MED ORDER — ACETAMINOPHEN 325 MG PO TABS
650.0000 mg | ORAL_TABLET | Freq: Once | ORAL | Status: AC
  Administered 2013-07-28: 650 mg via ORAL
  Filled 2013-07-28: qty 2

## 2013-07-28 MED ORDER — IBUPROFEN 800 MG PO TABS
800.0000 mg | ORAL_TABLET | Freq: Once | ORAL | Status: AC
  Administered 2013-07-28: 800 mg via ORAL
  Filled 2013-07-28: qty 1

## 2013-07-28 MED ORDER — IBUPROFEN 400 MG PO TABS
800.0000 mg | ORAL_TABLET | Freq: Once | ORAL | Status: AC
  Administered 2013-07-28: 800 mg via ORAL
  Filled 2013-07-28: qty 2

## 2013-07-28 MED ORDER — OSELTAMIVIR PHOSPHATE 75 MG PO CAPS: 75.0000 mg | ORAL_CAPSULE | Freq: Two times a day (BID) | ORAL | Status: DC

## 2013-07-28 NOTE — ED Provider Notes (Signed)
Medical screening examination/treatment/procedure(s) were performed by non-physician practitioner and as supervising physician I was immediately available for consultation/collaboration.   Naome Brigandi, MD 07/28/13 2229 

## 2013-07-28 NOTE — ED Notes (Signed)
Pt complains of a fever and body aches for two days

## 2013-07-28 NOTE — ED Provider Notes (Signed)
Zahir Eisenhour S 1:00AM patient discussed in sign out. Patient experiencing cough, body aches and fever. There is 102.3. Labs and chest x-ray pending.  Patient feeling improved after Tylenol. Fever also improved. At this time patient requesting return home. No overly concerning findings on lab testing. Slight pancytopenia. Symptoms most consistent with viral infection. Patient was advised to have a recheck of her lab tests later this week. Strict return precautions also given. Patient instructed to continue Tylenol lipophilic for fevers at home.    Results for orders placed during the hospital encounter of 07/27/13  URINALYSIS, ROUTINE W REFLEX MICROSCOPIC      Result Value Range   Color, Urine YELLOW  YELLOW   APPearance CLOUDY (*) CLEAR   Specific Gravity, Urine 1.034 (*) 1.005 - 1.030   pH 6.5  5.0 - 8.0   Glucose, UA NEGATIVE  NEGATIVE mg/dL   Hgb urine dipstick NEGATIVE  NEGATIVE   Bilirubin Urine NEGATIVE  NEGATIVE   Ketones, ur 15 (*) NEGATIVE mg/dL   Protein, ur NEGATIVE  NEGATIVE mg/dL   Urobilinogen, UA 0.2  0.0 - 1.0 mg/dL   Nitrite POSITIVE (*) NEGATIVE   Leukocytes, UA NEGATIVE  NEGATIVE  PREGNANCY, URINE      Result Value Range   Preg Test, Ur NEGATIVE  NEGATIVE  URINE MICROSCOPIC-ADD ON      Result Value Range   Squamous Epithelial / LPF MANY (*) RARE   WBC, UA 0-2  <3 WBC/hpf   Bacteria, UA MANY (*) RARE  CBC WITH DIFFERENTIAL      Result Value Range   WBC 3.6 (*) 4.0 - 10.5 K/uL   RBC 4.25  3.87 - 5.11 MIL/uL   Hemoglobin 11.7 (*) 12.0 - 15.0 g/dL   HCT 40.9 (*) 81.1 - 91.4 %   MCV 81.4  78.0 - 100.0 fL   MCH 27.5  26.0 - 34.0 pg   MCHC 33.8  30.0 - 36.0 g/dL   RDW 78.2  95.6 - 21.3 %   Platelets 128 (*) 150 - 400 K/uL   Neutrophils Relative % 72  43 - 77 %   Neutro Abs 2.6  1.7 - 7.7 K/uL   Lymphocytes Relative 11 (*) 12 - 46 %   Lymphs Abs 0.4 (*) 0.7 - 4.0 K/uL   Monocytes Relative 16 (*) 3 - 12 %   Monocytes Absolute 0.6  0.1 - 1.0 K/uL   Eosinophils  Relative 1  0 - 5 %   Eosinophils Absolute 0.0  0.0 - 0.7 K/uL   Basophils Relative 0  0 - 1 %   Basophils Absolute 0.0  0.0 - 0.1 K/uL  BASIC METABOLIC PANEL      Result Value Range   Sodium 135 (*) 137 - 147 mEq/L   Potassium 3.5 (*) 3.7 - 5.3 mEq/L   Chloride 100  96 - 112 mEq/L   CO2 21  19 - 32 mEq/L   Glucose, Bld 91  70 - 99 mg/dL   BUN 12  6 - 23 mg/dL   Creatinine, Ser 0.86  0.50 - 1.10 mg/dL   Calcium 8.6  8.4 - 57.8 mg/dL   GFR calc non Af Amer >90  >90 mL/min   GFR calc Af Amer >90  >90 mL/min     Angus Seller, PA-C 07/28/13 2113

## 2013-07-28 NOTE — ED Notes (Signed)
First dose of ibuprofen was dropped on the floor

## 2013-07-28 NOTE — ED Provider Notes (Signed)
CSN: [id number]     Arrival date & time 07/27/13  2125 History   First MD Initiated Contact with Patient 07/27/13 2232     Chief Complaint  Patient presents with  . Generalized Body Aches  . Back Pain   (Consider location/radiation/quality/duration/timing/severity/associated sxs/prior Treatment) Patient is a 27 y.o. female presenting with back pain. The history is provided by the patient. No language interpreter was used.  Back Pain Associated symptoms: fever and headaches   Associated symptoms: no abdominal pain, no chest pain and no weakness   Cindy Macias is a 27 y/o F with no significant PMHx presenting to the ED with back pain, generalized bodyaches, cough, nasal congestion, headache that has been ongoing for the past 2 weeks. Patient reported that the back pain is localized to her lower back described as an aching pain that radiates up her back to her neck. Reported that she has been experiencing neck pain described as an aching sensation. Patient reported that she has been coughing up phlegm, mainly of a clear color. Stated that she has been having nasal congestion - reported that she has been blowing her nose with yellowish congestion noted. Reported that she has been having chills. Reported facial pressure to the maxillary and frontal regions. Stated bilateral ear pain. Stated that she has not received the flu vaccine. Patient works at [employer]. Denied nausea, vomiting, diarrhea, abdominal pain, difficulty swallowing, sore throat, chest pain, shortness of breath, difficulty breathing. [facility]  History reviewed. No pertinent past medical history. Past Surgical History  Procedure Laterality Date  . Cesarean section     History reviewed. No pertinent family history. History  Substance Use Topics  . Smoking status: Current Every Day Smoker  . Smokeless tobacco: Not on file  . Alcohol Use: No   OB History   Grav Para Term Preterm Abortions TAB SAB Ect Mult  Living                 Review of Systems  Constitutional: Positive for fever and chills. Negative for appetite change.  HENT: Positive for congestion, ear pain, rhinorrhea, sinus pressure and sore throat. Negative for postnasal drip and trouble swallowing.   Eyes: Negative for pain and visual disturbance.  Respiratory: Positive for cough. Negative for chest tightness, shortness of breath and wheezing.   Cardiovascular: Negative for chest pain.  Gastrointestinal: Negative for nausea, vomiting, abdominal pain, diarrhea and constipation.  Musculoskeletal: Positive for back pain and myalgias (Generalized).  Neurological: Positive for headaches. Negative for dizziness and weakness.  All other systems reviewed and are negative.    Allergies  Review of patient's allergies indicates no known allergies.  Home Medications   Current Outpatient Rx  Name  Route  Sig  Dispense  Refill  . naproxen sodium (ANAPROX) 220 MG tablet   Oral   Take 440 mg by mouth 2 (two) times daily with a meal.         . Phenylephrine-APAP-Guaifenesin (MUCINEX FAST-MAX COLD & SINUS) 10-650-400 MG/20ML LIQD   Oral   Take 20 mLs by mouth 4 (four) times daily as needed (fever, pain).          BP 147/77  Pulse 89  Temp(Src) 102.3 F (39.1 C) (Oral)  Resp 20  Ht 5\' 2"  (1.575 m)  Wt 190 lb (86.183 kg)  BMI 34.74 kg/m2  SpO2 100%  LMP 07/03/2013 Physical Exam  Nursing note and vitals reviewed. Constitutional: She is oriented to person, place, and time.  She appears well-developed and well-nourished. No distress.  HENT:  Head: Normocephalic and atraumatic.  Right Ear: External ear normal.  Left Ear: External ear normal.  Mouth/Throat: Oropharynx is clear and moist. No oropharyngeal exudate.  Discomfort upon palpation to frontal and maxillary sinuses bilaterally Negative facial swelling Negative swelling, erythema, inflammation, lesions, sores, exudate, petechiae noted to the posterior oropharynx and  bilateral tonsils. Negative postnasal drip.  Eyes: Conjunctivae and EOM are normal. Pupils are equal, round, and reactive to light. Right eye exhibits no discharge. Left eye exhibits no discharge.  Neck: Normal range of motion. Neck supple.  Negative neck stiffness Negative nuchal rigidity Negative cervical lymphadenopathy Discomfort upon palpation to bilateral aspects of the neck-muscular in nature Negative Kernig's sign Negative Brudzinski's sign  Cardiovascular: Normal rate, regular rhythm and normal heart sounds.  Exam reveals no friction rub.   No murmur heard. Pulses:      Radial pulses are 2+ on the right side, and 2+ on the left side.       Dorsalis pedis pulses are 2+ on the right side, and 2+ on the left side.  Cap refill less than 3 seconds  Pulmonary/Chest: Effort normal and breath sounds normal. No respiratory distress. She has no wheezes. She has no rales.  Negative respiratory distress Airway intact Patient is able to speak in full sentences without difficulty Negative use of accessory muscles  Musculoskeletal: Normal range of motion. She exhibits tenderness.  Full ROM to upper and lower extremities without difficulty noted, negative ataxia noted  Generalized discomfort upon palpation to the shoulders, arms and back-muscular in nature. Generalized myalgia.   Lymphadenopathy:    She has no cervical adenopathy.  Neurological: She is alert and oriented to person, place, and time. No cranial nerve deficit. She exhibits normal muscle tone. Coordination normal.  Cranial nerves III-XII grossly intact Strength 5+/5+ to upper and lower extremities bilaterally with resistance applied, equal distribution noted  Skin: Skin is warm and dry. No rash noted. She is not diaphoretic. No erythema.  Psychiatric: She has a normal mood and affect. Her behavior is normal. Thought content normal.    ED Course  Procedures (including critical care time)  Results for orders placed during the  hospital encounter of 07/27/13  URINALYSIS, ROUTINE W REFLEX MICROSCOPIC      Result Value Range   Color, Urine YELLOW  YELLOW   APPearance CLOUDY (*) CLEAR   Specific Gravity, Urine 1.034 (*) 1.005 - 1.030   pH 6.5  5.0 - 8.0   Glucose, UA NEGATIVE  NEGATIVE mg/dL   Hgb urine dipstick NEGATIVE  NEGATIVE   Bilirubin Urine NEGATIVE  NEGATIVE   Ketones, ur 15 (*) NEGATIVE mg/dL   Protein, ur NEGATIVE  NEGATIVE mg/dL   Urobilinogen, UA 0.2  0.0 - 1.0 mg/dL   Nitrite POSITIVE (*) NEGATIVE   Leukocytes, UA NEGATIVE  NEGATIVE  PREGNANCY, URINE      Result Value Range   Preg Test, Ur NEGATIVE  NEGATIVE  URINE MICROSCOPIC-ADD ON      Result Value Range   Squamous Epithelial / LPF MANY (*) RARE   WBC, UA 0-2  <3 WBC/hpf   Bacteria, UA MANY (*) RARE    Labs Review Labs Reviewed  URINALYSIS, ROUTINE W REFLEX MICROSCOPIC - Abnormal; Notable for the following:    APPearance CLOUDY (*)    Specific Gravity, Urine 1.034 (*)    Ketones, ur 15 (*)    Nitrite POSITIVE (*)    All other components within  normal limits  URINE MICROSCOPIC-ADD ON - Abnormal; Notable for the following:    Squamous Epithelial / LPF MANY (*)    Bacteria, UA MANY (*)    All other components within normal limits  PREGNANCY, URINE   Imaging Review No results found.  EKG Interpretation   None       MDM  No diagnosis found.  Medications  acetaminophen (TYLENOL) tablet 650 mg (650 mg Oral Given 07/28/13 0111)   Filed Vitals:   07/27/13 2141 07/28/13 0110  BP: 147/77   Pulse: 89   Temp: 99.1 F (37.3 C) 102.3 F (39.1 C)  TempSrc: Oral Oral  Resp: 20   Height: 5\' 2"  (1.575 m)   Weight: 190 lb (86.183 kg)   SpO2: 100%    Patient presenting to the ED with generalized bodyaches, back pain, chills, nasal congestion, and cough that has been ongoing for the past 2 weeks. Reported that she has not been using any OTC medications to aid in relief.  Alert and oriented. GCS 15. Heart rate and rhythm normal.  Lungs clear to auscultation to upper and lower lobes bilaterally. Cap refill less than 3 seconds. Pulses palpable and strong, radial and DP 2+ bilaterally. Discomfort upon palpation to the frontal and maxillary sinuses. Unremarkable oral exam. Strength intact to upper and lower extremities bilaterally with equal resistance. Discomfort upon palpation to the arms, shoulders and back - generalized myalgias.  Urine pregnancy negative. UA noted nitrites with elevated specific gravity - dehydrated - many bacteria noted.  Fever noted in ED setting with a temperature of 102.46F - Tylenol administered. Chest xray and blood work ordered and pending results. Discussed case with Phill Mutter. Dammen, PA-C at change in shift. Transfer of care to [employer]. Dammen, PA-C at change in shift.   Raymon Mutton, PA-C 07/28/13 [phone]

## 2013-07-29 MED ORDER — KETOROLAC TROMETHAMINE 30 MG/ML IJ SOLN
30.0000 mg | Freq: Once | INTRAMUSCULAR | Status: AC
  Administered 2013-07-29: 30 mg via INTRAVENOUS
  Filled 2013-07-29: qty 1

## 2013-07-29 MED ORDER — METOCLOPRAMIDE HCL 5 MG/ML IJ SOLN
10.0000 mg | Freq: Once | INTRAMUSCULAR | Status: AC
Start: 2013-07-29 — End: 2013-07-29
  Administered 2013-07-29: 10 mg via INTRAVENOUS
  Filled 2013-07-29: qty 2

## 2013-07-29 MED ORDER — DIPHENHYDRAMINE HCL 50 MG/ML IJ SOLN
25.0000 mg | Freq: Once | INTRAMUSCULAR | Status: AC
  Administered 2013-07-29: 25 mg via INTRAVENOUS
  Filled 2013-07-29: qty 1

## 2013-07-29 MED ORDER — SODIUM CHLORIDE 0.9 % IV BOLUS (SEPSIS)
1000.0000 mL | Freq: Once | INTRAVENOUS | Status: AC
  Administered 2013-07-29: 1000 mL via INTRAVENOUS

## 2013-07-29 NOTE — ED Provider Notes (Signed)
CSN: [id number]     Arrival date & time 07/28/13  2238 History   First MD Initiated Contact with Patient 07/29/13 0112     Chief Complaint  Patient presents with  . Fever  . Generalized Body Aches   (Consider location/radiation/quality/duration/timing/severity/associated sxs/prior Treatment) HPI Comments: Patient presents with fever, cough, body aches, HA x 2 days. Aleve, Mucinex not helping. No N/V/D, urinary symptoms. + photophobia. Seen in ED yesterday, neg CXR, labs. She continues to have HA. No neck pain. She has not had a flu shot this year. The onset of this condition was acute. The course is constant. Aggravating factors: none. Alleviating factors: none.    Patient is a 27 y.o. female presenting with fever. The history is provided by the patient.  Fever Associated symptoms: cough, headaches and myalgias   Associated symptoms: no chills, no congestion, no diarrhea, no dysuria, no ear pain, no nausea, no rash, no rhinorrhea, no sore throat and no vomiting     History reviewed. No pertinent past medical history. Past Surgical History  Procedure Laterality Date  . Cesarean section     History reviewed. No pertinent family history. History  Substance Use Topics  . Smoking status: Current Every Day Smoker  . Smokeless tobacco: Not on file  . Alcohol Use: No   OB History   Grav Para Term Preterm Abortions TAB SAB Ect Mult Living                 Review of Systems  Constitutional: Positive for fever. Negative for chills and fatigue.  HENT: Negative for congestion, ear pain, rhinorrhea, sinus pressure and sore throat.   Eyes: Positive for photophobia. Negative for redness and visual disturbance.  Respiratory: Positive for cough. Negative for wheezing.   Gastrointestinal: Negative for nausea, vomiting, abdominal pain and diarrhea.  Genitourinary: Negative for dysuria.  Musculoskeletal: Positive for myalgias. Negative for neck stiffness.  Skin: Negative for rash.   Neurological: Positive for headaches.  Hematological: Negative for adenopathy.    Allergies  Review of patient's allergies indicates no known allergies.  Home Medications   Current Outpatient Rx  Name  Route  Sig  Dispense  Refill  . naproxen sodium (ANAPROX) 220 MG tablet   Oral   Take 440 mg by mouth 2 (two) times daily with a meal.         . Phenylephrine-APAP-Guaifenesin (MUCINEX FAST-MAX COLD & SINUS) 10-650-400 MG/20ML LIQD   Oral   Take 20 mLs by mouth 4 (four) times daily as needed (fever, pain).          BP 127/81  Pulse 84  Temp(Src) 100.4 F (38 C) (Oral)  Resp 14  SpO2 100%  LMP 07/03/2013  Physical Exam  Nursing note and vitals reviewed. Constitutional: She is oriented to person, place, and time. She appears well-developed and well-nourished.  HENT:  Head: Normocephalic and atraumatic.  Right Ear: Tympanic membrane, external ear and ear canal normal.  Left Ear: Tympanic membrane, external ear and ear canal normal.  Nose: Mucosal edema present. No rhinorrhea.  Mouth/Throat: Uvula is midline, oropharynx is clear and moist and mucous membranes are normal. Mucous membranes are not dry. No oral lesions. No trismus in the jaw. No uvula swelling. No oropharyngeal exudate, posterior oropharyngeal edema, posterior oropharyngeal erythema or tonsillar abscesses.  Eyes: Conjunctivae, EOM and lids are normal. Pupils are equal, round, and reactive to light. Right eye exhibits no discharge. Left eye exhibits no discharge. Right eye exhibits no nystagmus. Left eye exhibits  no nystagmus.  Neck: Normal range of motion. Neck supple.  No meningismus.  Cardiovascular: Normal rate, regular rhythm and normal heart sounds.   Pulmonary/Chest: Effort normal and breath sounds normal. No respiratory distress. She has no wheezes. She has no rales.  Abdominal: Soft. There is no tenderness.  Musculoskeletal:       Cervical back: She exhibits normal range of motion, no tenderness and  no bony tenderness.  Lymphadenopathy:    She has no cervical adenopathy.  Neurological: She is alert and oriented to person, place, and time. She has normal strength and normal reflexes. No cranial nerve deficit or sensory deficit. She displays a negative Romberg sign. Coordination and gait normal. GCS eye subscore is 4. GCS verbal subscore is 5. GCS motor subscore is 6.  Skin: Skin is warm and dry.  Psychiatric: She has a normal mood and affect.    ED Course  Procedures (including critical care time) Labs Review Labs Reviewed - No data to display Imaging Review Dg Chest 2 View  07/28/2013   CLINICAL DATA:  Body aches, back pain  EXAM: CHEST  2 VIEW  COMPARISON:  None.  FINDINGS: The heart size and mediastinal contours are within normal limits. Both lungs are clear. The visualized skeletal structures are unremarkable.  IMPRESSION: No active cardiopulmonary disease.   Electronically Signed   By: Elige Ko   On: 07/28/2013 01:40    EKG Interpretation   None      1:15 AM Patient seen and examined. Medications ordered. Treatments ordered.   Vital signs reviewed and are as follows: Filed Vitals:   07/29/13 0006  BP:   Pulse:   Temp: 100.4 F (38 C)  Resp:   BP 109/67  Pulse 83  Temp(Src) 98.1 F (36.7 C) (Oral)  Resp 16  SpO2 98%  LMP 07/03/2013  2:55 AM Handoff to Dammen PA-C. Plan: re-eval after supportive treatment, d/c to home when improved.   BP 109/67  Pulse 83  Temp(Src) 98.1 F (36.7 C) (Oral)  Resp 16  SpO2 98%  LMP 07/03/2013   MDM  No diagnosis found.   Patient with suspected influenza, HA. No neck pain or meningismus on exam to suggest meningitis. She appears non-toxic. Labs from yesterday reviewed. Do not feel repeat imaging/labs necessary at this time.    Renne Crigler, PA-C 07/29/13 [phone]

## 2013-07-29 NOTE — ED Provider Notes (Signed)
Cindy Macias S 2:00 AM the patient discussed in sign out. Patient return to the emergency room with continued symptoms of body aches and headache. She otherwise appears well with symptoms consistent of influenza. She is receiving IV fluids and medications for headache. Plan to reassess.  3:00 AM the patient reports significant improvements of headaches after medicine. She is also feeling better with IV fluids. She has received 500 mL at this time. Fever did improve with treatments. Patient states she has been taking Tylenol and ibuprofen home with improvements. She did not purchase Tamiflu. Patient otherwise feeling better and ready for discharge home.    Angus Seller, PA-C 07/29/13 [phone]

## 2013-07-29 NOTE — ED Provider Notes (Signed)
Medical screening examination/treatment/procedure(s) were performed by non-physician practitioner and as supervising physician I was immediately available for consultation/collaboration.  EKG Interpretation   None        Doug Sou, MD 07/29/13 1053

## 2013-07-30 NOTE — ED Provider Notes (Signed)
Medical screening examination/treatment/procedure(s) were performed by non-physician practitioner and as supervising physician I was immediately available for consultation/collaboration.    Rhyse Loux, MD 07/30/13 0953 

## 2013-07-30 NOTE — ED Provider Notes (Signed)
Medical screening examination/treatment/procedure(s) were performed by non-physician practitioner and as supervising physician I was immediately available for consultation/collaboration.    Hank Walling, MD 07/30/13 0953 

## 2014-03-19 ENCOUNTER — Emergency Department (HOSPITAL_COMMUNITY)
Admission: EM | Admit: 2014-03-19 | Discharge: 2014-03-19 | Disposition: A | Discharge status: Home / Self Care | Payer: Self-pay | Attending: Emergency Medicine | Admitting: Emergency Medicine

## 2014-03-19 ENCOUNTER — Emergency Department (HOSPITAL_COMMUNITY): Payer: Self-pay

## 2014-03-19 ENCOUNTER — Encounter (HOSPITAL_COMMUNITY): Payer: Self-pay | Admitting: Emergency Medicine

## 2014-03-19 DIAGNOSIS — Z87891 Personal history of nicotine dependence: Secondary | ICD-10-CM | POA: Insufficient documentation

## 2014-03-19 DIAGNOSIS — R109 Unspecified abdominal pain: Secondary | ICD-10-CM | POA: Insufficient documentation

## 2014-03-19 DIAGNOSIS — Z9889 Other specified postprocedural states: Secondary | ICD-10-CM | POA: Insufficient documentation

## 2014-03-19 DIAGNOSIS — O219 Vomiting of pregnancy, unspecified: Secondary | ICD-10-CM

## 2014-03-19 DIAGNOSIS — Z331 Pregnant state, incidental: Secondary | ICD-10-CM

## 2014-03-19 DIAGNOSIS — O218 Other vomiting complicating pregnancy: Secondary | ICD-10-CM | POA: Insufficient documentation

## 2014-03-19 LAB — URINALYSIS, ROUTINE W REFLEX MICROSCOPIC
Bilirubin Urine: NEGATIVE
Glucose, UA: NEGATIVE mg/dL
Hgb urine dipstick: NEGATIVE
Ketones, ur: 40 mg/dL — AB
LEUKOCYTES UA: NEGATIVE
Nitrite: POSITIVE — AB
PH: 6 (ref 5.0–8.0)
Protein, ur: 30 mg/dL — AB
Specific Gravity, Urine: 1.036 — ABNORMAL HIGH (ref 1.005–1.030)
Urobilinogen, UA: 1 mg/dL (ref 0.0–1.0)

## 2014-03-19 LAB — CBC WITH DIFFERENTIAL/PLATELET
BASOS PCT: 0 % (ref 0–1)
Basophils Absolute: 0 10*3/uL (ref 0.0–0.1)
Eosinophils Absolute: 0.1 10*3/uL (ref 0.0–0.7)
Eosinophils Relative: 1 % (ref 0–5)
HCT: 34.6 % — ABNORMAL LOW (ref 36.0–46.0)
HEMOGLOBIN: 12 g/dL (ref 12.0–15.0)
LYMPHS PCT: 19 % (ref 12–46)
Lymphs Abs: 1.1 10*3/uL (ref 0.7–4.0)
MCH: 27.6 pg (ref 26.0–34.0)
MCHC: 34.7 g/dL (ref 30.0–36.0)
MCV: 79.5 fL (ref 78.0–100.0)
MONO ABS: 0.6 10*3/uL (ref 0.1–1.0)
MONOS PCT: 11 % (ref 3–12)
NEUTROS ABS: 3.9 10*3/uL (ref 1.7–7.7)
NEUTROS PCT: 69 % (ref 43–77)
Platelets: 155 10*3/uL (ref 150–400)
RBC: 4.35 MIL/uL (ref 3.87–5.11)
RDW: 13.3 % (ref 11.5–15.5)
WBC: 5.6 10*3/uL (ref 4.0–10.5)

## 2014-03-19 LAB — COMPREHENSIVE METABOLIC PANEL WITH GFR
ALT: 13 U/L (ref 0–35)
AST: 15 U/L (ref 0–37)
Albumin: 3.7 g/dL (ref 3.5–5.2)
Alkaline Phosphatase: 35 U/L — ABNORMAL LOW (ref 39–117)
Anion gap: 14 (ref 5–15)
BUN: 11 mg/dL (ref 6–23)
CO2: 21 meq/L (ref 19–32)
Calcium: 9.4 mg/dL (ref 8.4–10.5)
Chloride: 99 meq/L (ref 96–112)
Creatinine, Ser: 0.74 mg/dL (ref 0.50–1.10)
GFR calc Af Amer: >90 mL/min
GFR calc non Af Amer: >90 mL/min
Glucose, Bld: 91 mg/dL (ref 70–99)
Potassium: 3.4 meq/L — ABNORMAL LOW (ref 3.7–5.3)
Sodium: 134 meq/L — ABNORMAL LOW (ref 137–147)
Total Bilirubin: 0.5 mg/dL (ref 0.3–1.2)
Total Protein: 6.9 g/dL (ref 6.0–8.3)

## 2014-03-19 LAB — LIPASE, BLOOD: Lipase: 25 U/L (ref 11–59)

## 2014-03-19 LAB — URINE MICROSCOPIC-ADD ON

## 2014-03-19 LAB — POC URINE PREG, ED: Preg Test, Ur: POSITIVE — AB

## 2014-03-19 MED ORDER — METOCLOPRAMIDE HCL 5 MG/ML IJ SOLN
10.0000 mg | Freq: Once | INTRAMUSCULAR | Status: AC
  Administered 2014-03-19: 10 mg via INTRAVENOUS
  Filled 2014-03-19: qty 2

## 2014-03-19 MED ORDER — PRENATAL COMPLETE 14-0.4 MG PO TABS: 1.0000 | ORAL_TABLET | Freq: Every day | ORAL | Status: DC

## 2014-03-19 MED ORDER — METOCLOPRAMIDE HCL 5 MG PO TABS: 5.0000 mg | ORAL_TABLET | Freq: Four times a day (QID) | ORAL | Status: DC | PRN

## 2014-03-19 MED ORDER — FAMOTIDINE IN NACL 20-0.9 MG/50ML-% IV SOLN
20.0000 mg | Freq: Once | INTRAVENOUS | Status: AC
  Administered 2014-03-19: 20 mg via INTRAVENOUS
  Filled 2014-03-19: qty 50

## 2014-03-19 MED ORDER — SODIUM CHLORIDE 0.9 % IV SOLN: INTRAVENOUS | Status: DC

## 2014-03-19 MED ORDER — SODIUM CHLORIDE 0.9 % IV BOLUS (SEPSIS)
500.0000 mL | Freq: Once | INTRAVENOUS | Status: AC
  Administered 2014-03-19: 10:00:00 via INTRAVENOUS

## 2014-03-19 NOTE — ED Provider Notes (Signed)
CSN: [id number]     Arrival date & time 03/19/14  0704 History   First MD Initiated Contact with Patient 03/19/14 0730     Chief Complaint  Patient presents with  . Abdominal Pain      HPI Pt was seen at 0730. Per pt, c/o gradual onset and persistence of multiple intermittent episodes of N/V for the past 3 to 4 days. Has been associated with upper abd "bubbing feeling." Denies diarrhea, no CP/SOB, no back pain, no fevers, no black or blood in stools or emesis, no dysuria/hematuria, no vaginal bleeding/discharge, no pelvic pain.     History reviewed. No pertinent past medical history.  Past Surgical History  Procedure Laterality Date  . Cesarean section      History  Substance Use Topics  . Smoking status: Former Games developermoker  . Smokeless tobacco: Not on file  . Alcohol Use: No    Review of Systems ROS: Statement: All systems negative except as marked or noted in the HPI; Constitutional: Negative for fever and chills. ; ; Eyes: Negative for eye pain, redness and discharge. ; ; ENMT: Negative for ear pain, hoarseness, nasal congestion, sinus pressure and sore throat. ; ; Cardiovascular: Negative for chest pain, palpitations, diaphoresis, dyspnea and peripheral edema. ; ; Respiratory: Negative for cough, wheezing and stridor. ; ; Gastrointestinal: +N/V, abd pain. Negative for diarrhea, blood in stool, hematemesis, jaundice and rectal bleeding. . ; ; Genitourinary: Negative for dysuria, flank pain and hematuria. ; ; GYN:  No vaginal bleeding, no vaginal discharge, no vulvar pain.;;  Musculoskeletal: Negative for back pain and neck pain. Negative for swelling and trauma.; ; Skin: Negative for pruritus, rash, abrasions, blisters, bruising and skin lesion.; ; Neuro: Negative for headache, lightheadedness and neck stiffness. Negative for weakness, altered level of consciousness , altered mental status, extremity weakness, paresthesias, involuntary movement, seizure and syncope.      Allergies   Review of patient's allergies indicates no known allergies.  Home Medications   Prior to Admission medications   Not on File   BP 126/75  Pulse 87  Temp(Src) 98.9 F (37.2 C) (Oral)  Resp 20  SpO2 99%  LMP 03/12/2014 Physical Exam 0735: Physical examination:  Nursing notes reviewed; Vital signs and O2 SAT reviewed;  Constitutional: Well developed, Well nourished, Well hydrated, In no acute distress; Head:  Normocephalic, atraumatic; Eyes: EOMI, PERRL, No scleral icterus; ENMT: Mouth and pharynx normal, Mucous membranes moist; Neck: Supple, Full range of motion, No lymphadenopathy; Cardiovascular: Regular rate and rhythm, No murmur, rub, or gallop; Respiratory: Breath sounds clear & equal bilaterally, No rales, rhonchi, wheezes.  Speaking full sentences with ease, Normal respiratory effort/excursion; Chest: Nontender, Movement normal; Abdomen: Soft, +mid-epigastric tenderness to palp. No rebound or guarding. Nondistended, Normal bowel sounds; Genitourinary: No CVA tenderness; Extremities: Pulses normal, No tenderness, No edema, No calf edema or asymmetry.; Neuro: AA&Ox3, Major CN grossly intact.  Speech clear. No gross focal motor or sensory deficits in extremities. Climbs on and off stretcher easily by herself. Gait steady.; Skin: Color normal, Warm, Dry.   ED Course  Procedures     MDM  MDM Reviewed: previous chart, nursing note and vitals Reviewed previous: labs Interpretation: labs and ultrasound    Results for orders placed during the hospital encounter of 03/19/14  URINALYSIS, ROUTINE W REFLEX MICROSCOPIC      Result Value Ref Range   Color, Urine YELLOW  YELLOW   APPearance CLOUDY (*) CLEAR   Specific Gravity, Urine 1.036 (*) 1.005 -  1.030   pH 6.0  5.0 - 8.0   Glucose, UA NEGATIVE  NEGATIVE mg/dL   Hgb urine dipstick NEGATIVE  NEGATIVE   Bilirubin Urine NEGATIVE  NEGATIVE   Ketones, ur 40 (*) NEGATIVE mg/dL   Protein, ur 30 (*) NEGATIVE mg/dL   Urobilinogen, UA  1.0  0.0 - 1.0 mg/dL   Nitrite POSITIVE (*) NEGATIVE   Leukocytes, UA NEGATIVE  NEGATIVE  CBC WITH DIFFERENTIAL      Result Value Ref Range   WBC 5.6  4.0 - 10.5 K/uL   RBC 4.35  3.87 - 5.11 MIL/uL   Hemoglobin 12.0  12.0 - 15.0 g/dL   HCT 81.1 (*) 91.4 - 78.2 %   MCV 79.5  78.0 - 100.0 fL   MCH 27.6  26.0 - 34.0 pg   MCHC 34.7  30.0 - 36.0 g/dL   RDW 95.6  21.3 - 08.6 %   Platelets 155  150 - 400 K/uL   Neutrophils Relative % 69  43 - 77 %   Neutro Abs 3.9  1.7 - 7.7 K/uL   Lymphocytes Relative 19  12 - 46 %   Lymphs Abs 1.1  0.7 - 4.0 K/uL   Monocytes Relative 11  3 - 12 %   Monocytes Absolute 0.6  0.1 - 1.0 K/uL   Eosinophils Relative 1  0 - 5 %   Eosinophils Absolute 0.1  0.0 - 0.7 K/uL   Basophils Relative 0  0 - 1 %   Basophils Absolute 0.0  0.0 - 0.1 K/uL  COMPREHENSIVE METABOLIC PANEL      Result Value Ref Range   Sodium 134 (*) 137 - 147 mEq/L   Potassium 3.4 (*) 3.7 - 5.3 mEq/L   Chloride 99  96 - 112 mEq/L   CO2 21  19 - 32 mEq/L   Glucose, Bld 91  70 - 99 mg/dL   BUN 11  6 - 23 mg/dL   Creatinine, Ser 5.78  0.50 - 1.10 mg/dL   Calcium 9.4  8.4 - 46.9 mg/dL   Total Protein 6.9  6.0 - 8.3 g/dL   Albumin 3.7  3.5 - 5.2 g/dL   AST 15  0 - 37 U/L   ALT 13  0 - 35 U/L   Alkaline Phosphatase 35 (*) 39 - 117 U/L   Total Bilirubin 0.5  0.3 - 1.2 mg/dL   GFR calc non Af Amer >90  >90 mL/min   GFR calc Af Amer >90  >90 mL/min   Anion gap 14  5 - 15  LIPASE, BLOOD      Result Value Ref Range   Lipase 25  11 - 59 U/L  URINE MICROSCOPIC-ADD ON      Result Value Ref Range   Squamous Epithelial / LPF MANY (*) RARE   WBC, UA 0-2  <3 WBC/hpf   RBC / HPF 0-2  <3 RBC/hpf   Bacteria, UA MANY (*) RARE   Urine-Other MUCOUS PRESENT    POC URINE PREG, ED      Result Value Ref Range   Preg Test, Ur POSITIVE (*) NEGATIVE   US Abdomen Limited 03/19/2014   CLINICAL DATA:  Upper abdominal pain, nausea and vomiting  EXAM: US ABDOMEN LIMITED - RIGHT UPPER QUADRANT  COMPARISON:   None.  FINDINGS: Gallbladder:  No gallstones or wall thickening visualized. No sonographic Murphy sign noted.  Common bile duct:  Diameter: 2.8 mm  Liver:  No focal lesion identified. Within normal  limits in parenchymal echogenicity.  IMPRESSION: Normal right upper quadrant ultrasound.  Normal gallbladder.   Electronically Signed   By: Genevive Bi M.D.   On: 03/19/2014 08:41    1010:  Pt has tol PO well while in the ED without N/V.  No stooling while in the ED.  Abd and pelvic exam benign, VSS. Feels better and wants to go home now. No clear UTI on Udip; UC pending. Dx and testing d/w pt.  Questions answered.  Verb understanding, agreeable to d/c home with outpt f/u.    Samuel Jester, DO 03/20/14 1612

## 2014-03-19 NOTE — Discharge Instructions (Signed)
°Emergency Department Resource Guide °1) Find a Doctor and Pay Out of Pocket °Although you won't have to find out who is covered by your insurance plan, it is a good idea to ask around and get recommendations. You will then need to call the office and see if the doctor you have chosen will accept you as a new patient and what types of options they offer for patients who are self-pay. Some doctors offer discounts or will set up payment plans for their patients who do not have insurance, but you will need to ask so you aren't surprised when you get to your appointment. ° °2) Contact Your Local Health Department °Not all health departments have doctors that can see patients for sick visits, but many do, so it is worth a call to see if yours does. If you don't know where your local health department is, you can check in your phone book. The CDC also has a tool to help you locate your state's health department, and many state websites also have listings of all of their local health departments. ° °3) Find a Walk-in Clinic °If your illness is not likely to be very severe or complicated, you may want to try a walk in clinic. These are popping up all over the country in pharmacies, drugstores, and shopping centers. They're usually staffed by nurse practitioners or physician assistants that have been trained to treat common illnesses and complaints. They're usually fairly quick and inexpensive. However, if you have serious medical issues or chronic medical problems, these are probably not your best option. ° °No Primary Care Doctor: °- Call [facility] Connect at  832-8000 - they can help you locate a primary care doctor that  accepts your insurance, provides certain services, etc. °- Physician Referral Service- 1-800-533-3463 ° °Chronic Pain Problems: °Organization         Address  Phone   Notes  °[facility]  ([phone]) [phone] Patients need to be referred by their primary care doctor.  ° °Medication  Assistance: °Organization         Address  Phone   Notes  °[place] [facility] [address] °[place], [place] [address] (336) 641-8030 --Must be a resident of [place] °-- Must have NO insurance coverage whatsoever (no Medicaid/ Medicare, etc.) °-- The pt. MUST have a primary care doctor that directs their care regularly and follows them in the community °  °[facility]  (866) 331-1348   °[employer]  (888) 892-1162   ° °Agencies that provide inexpensive medical care: °Organization         Address  Phone   Notes  °[facility]  (336) 832-8035   °[facility]    (336) 832-7272   °[facility] [address] °[place], [place] [address] (336) 832-4777   °[facility] [address], °[place] (336) 271-4999   °[facility]    (336) 373-0678   °[facility]    (336) 272-1050   °[facility] ° [address], [place] Phone:  (336) 832-4444, Fax:  (336) 832-4440 Hours of Operation:  9 am - 6 pm, M-F.  Also accepts Medicaid/Medicare and self-pay.  °[facility] ° [address], [place] Phone: (336) [phone], Fax: (336) [phone]. Hours of Operation:  8:30 am - 5:30 pm, M-F.  Also accepts Medicaid and self-pay.  °[facility] [place] [address], [place] Phone: (336) 878-6027   °[facility] [address], [place], [place] (336)723-1848, Ext. 123 Mondays & Thursdays: 7-9 AM.  First 15 patients are seen on a first come, first serve basis. °  ° °Medicaid-accepting [place] Providers: ° °Organization         Address  Phone   Notes  °[facility] [address], [place] (336) [phone] Also accepts self-pay patients.  °[facility] [address], [place] ° (336) 856-9996   °[facility] [address], [place]  (336) 288-8857   °[facility] [address] [place] [address], [place] (336) 299-7000   °Veita Bland [address], [place]  ° (336) 373-1557 Only accepts [place] Access Medicaid patients after they have their name applied to their card.  ° °Self-Pay (no insurance) in [place]: ° °Organization         Address  Phone   Notes  °Sickle Cell Patients, [facility] [address], [place] (336) 832-1970   °[facility] [address], [place] (336) 832-4400   °[facility] ° [address], [place] (336) 992-4800   °[facility]/Dr. Osei-Bonsu ° [address] [place] [address], [place] or [address], [place] (336) [phone] Phone number for both [place] and [place] locations is the same.  °[facility] [address], [place] (336) 299-0000   °[facility] [address] [place] [address], [place] or [address] (336) 852-7530 °(336) 878-2260   °[facility] [address], [place] (336) 350-1642, phone; (336) 294-5005, fax Sees patients 1st and 3rd Saturday of every month.  Must not qualify for public or private insurance (i.e. Medicaid, Medicare, [place] Health Choice, Veterans' Benefits) • Household income should be no more than 200% of the poverty level •The clinic cannot treat you if you are pregnant or think you are pregnant • Sexually transmitted diseases are not treated at the clinic.  ° ° °Dental Care: °Organization         Address  Phone  Notes  °[place] [facility] [address], [place] (336) 641-6152 Accepts children up to age 21 who are enrolled in Medicaid or [place] Health Choice; pregnant women with a Medicaid card; and children who have applied for Medicaid or [place] Health Choice, but were declined, whose parents can pay a reduced fee at time of service.  °[place]  [facility] [place]  [address], [place] (336) 641-7733 Accepts children up to age 21 who are enrolled in Medicaid or [place] Health Choice; pregnant women with a Medicaid card; and children who have applied for Medicaid or [place] Health Choice, but were declined, whose parents can pay a reduced fee at time of service.  °[facility] ° [address], [place] (336) 641-4533 Patients are seen by appointment only. Walk-ins are not accepted. [facility] will see patients 18 years of age and older. °Monday - Tuesday (8am-5pm) °Most Wednesdays (8:30-5pm) °$30 per visit, cash only  °[facility] ° [address], [place] (336) 641-4533 Patients are seen by appointment only. Walk-ins are not accepted. [facility] will see patients 18 years of age and older. °One   Wednesday Evening (Monthly: Volunteer Based).  $30 per visit, cash only  °[facility]  (919) 537-3737 for adults; Children under age 4, call Graduate Pediatric Dentistry at ([phone]) [phone]. Children aged 4-14, please call ([phone]) [phone] to request a pediatric application. ° Dental services are provided in all areas of dental care including fillings, crowns and bridges, complete and partial dentures, implants, gum treatment, root canals, and extractions. Preventive care is also provided. Treatment is provided to both adults and children. °Patients are selected via a lottery and there is often a waiting list. °  °[facility] [address], °[place] ° (336) 763-8833 [email or web address] °  °[facility] [address], [place], [place] (336)723-1848, Ext. 123 Second and Fourth Thursday of each month, opens at 6:30 AM; Clinic ends at 9 AM.  Patients are seen on a first-come first-served basis, and a limited number are seen during each clinic.  ° °[facility] ° [address], [place], [place] (336) 723-7904    Eligibility Requirements °You must have lived in [place], [place], or [place] counties for at least the last three months. °  You cannot be eligible for state or federal sponsored healthcare insurance, including Veterans Administration, Medicaid, or Medicare. °  You generally cannot be eligible for healthcare insurance through your employer.  °  How to apply: °Eligibility screenings are held every Tuesday and Wednesday afternoon from 1:00 pm until 4:00 pm. You do not need an appointment for the interview!  °[facility] [address], [place], [place] 336-631-2330   °[facility]  336-342-8273   °[facility]  336-703-3100   °[facility]  336-570-6415   ° °Behavioral Health Resources in the Community: °Intensive Outpatient Programs °Organization         Address  Phone  Notes  °[facility] [address], [place], [place] 336-878-6098   °[facility] [address], [place], [place] 336-832-9800   °[facility] [address], [place], [place] ° 336-882-2125   °[facility] [address],  °[place], [place] 1-800-853-5163 or 336-641-4981   °Substance Abuse Resources °Organization         Address  Phone  Notes  °[facility]  336-882-2125   °[facility]  336-784-9470   °[facility]  336-285-9073   °[facility]  336-845-3988   °Residential & Outpatient Substance Abuse Program  1-800-659-3381   °Psychological Services °Organization         Address  Phone  Notes  °[facility]  336- 832-9600   °[facility]  336- 378-7881   °[facility] [address], [place] 1-800-853-5163 or 336-641-4981   ° °Mobile Crisis Teams °Organization         Address  Phone  Notes  °[facility], Mobile Crisis Care Unit  1-877-626-1772   °Assertive °[facility] ° [address]  [place], [place] 336-834-9664   °Sharon DeEsch [address] °[place] 336-554-5454   ° °Self-Help/Support Groups °Organization         Address  Phone             Notes  °[facility] of  - variety of support groups  336- 373-1402 Call for more information  °Narcotics Anonymous (NA), [facility] [address], °[place]  2 meetings at this location  ° °  Residential Treatment Programs Organization         Address  Phone  Notes  [facility] [address],    [place]  1-610-960-45401-(250)189-8724   [facility]  [address], [place] [phone], [place], [place] [phone]   [facility] [address], [place] [phone] Admissions: 8am-3pm M-F  [facility] [address]    [place], [place] [phone]   [facility] [address], [place], [place] [phone]   [facility] [address]  [place], [place] [phone]   [facility] Intensive Outpatient [address], [place], [place] [phone]   [facility] [address]  [place], [place] [phone] or [phone]   [facility] [address] [place], [place] 323-557-3220(604)[phone] Accepts Medicaid  [facility] [address]  [place] [phone] Substance Abuse/Addiction Treatment   [facility]         Address  Phone  Notes  [facility]  (234)539-0203(888) 670-800-8187   Angie FavaJulie Brannon, PhD [address], [address] [place], [place]   [phone](336) [phone] or (581)[phone](336) (904) [phone]   [facility]   [address] [place], [place] (220) [phone](336) ([phone])[phone]   [facility] [address], [place], [place] ([phone](336) [phone] Insurance/Medicaid/sponsorship through [facility] [address]                                    [place], [place] [phone](336) [phone] Therapy/tele-psych/case    [facility] [address]   [place], [place] [phone](336) ([phone]    Dr. Lolly MustacheArfeen  [phone](336) [phone]   [facility]  [employer] [facility] 1) [address], [place] 2) [address], [place] 3)  [address], [place] [phone](336) [phone](336) [phone]  [phone](336) [phone]   [facility] Child Abuse Hotline (671)684-7939(336) [phone] or 8476053132(336) [phone] (After Hours)       Take the prescriptions as directed.  Increase your fluid intake (ie:  Gatoraide) for the next few days.  Eat a bland diet and advance to your regular diet slowly as you can tolerate it. Call your regular OB/GYN doctor today to schedule a follow up appointment within the next week.  Return to the Emergency Department immediately if not improving (or even worsening) despite taking the medicines as prescribed, any black or bloody stool or vomit, if you develop a fever over "101," or for any other concerns.

## 2014-03-19 NOTE — ED Notes (Signed)
Pt here with upper abdominal pain x3 days.  Pt states that she feels "bubbling" in her upper abdomen and also has nausea, vomiting and no bm for 4 days.  No fever or chills

## 2014-03-22 LAB — URINE CULTURE

## 2014-03-23 ENCOUNTER — Telehealth (HOSPITAL_BASED_OUTPATIENT_CLINIC_OR_DEPARTMENT_OTHER): Payer: Self-pay | Admitting: Emergency Medicine

## 2014-03-23 NOTE — Telephone Encounter (Signed)
Post ED Visit - Positive Culture Follow-up  Culture report reviewed by antimicrobial stewardship pharmacist:  Wes Dulaney, Pharm.D., BCPS  Celedonio Miyamoto, [address].D., BCPS  Georgina Pillion, Pharm.D., BCPS  [place], [address].D., BCPS, AAHIVP  Estella Husk, Pharm.D., BCPS, AAHIVP  Red Christians, Pharm.D.  Tennis Must, [place].D.  Positive urine culture >100,000 colonies/ml E. Coli Treated with none, no further patient follow-up is required at this time.  Berle Mull 03/23/2014, 1:40 PM

## 2014-07-19 ENCOUNTER — Emergency Department (HOSPITAL_COMMUNITY)
Admission: EM | Admit: 2014-07-19 | Discharge: 2014-07-19 | Disposition: A | Discharge status: Home / Self Care | Payer: Self-pay | Attending: Emergency Medicine | Admitting: Emergency Medicine

## 2014-07-19 ENCOUNTER — Emergency Department (HOSPITAL_COMMUNITY): Payer: Self-pay

## 2014-07-19 ENCOUNTER — Encounter (HOSPITAL_COMMUNITY): Payer: Self-pay | Admitting: *Deleted

## 2014-07-19 DIAGNOSIS — R51 Headache: Secondary | ICD-10-CM | POA: Insufficient documentation

## 2014-07-19 DIAGNOSIS — R002 Palpitations: Secondary | ICD-10-CM | POA: Insufficient documentation

## 2014-07-19 DIAGNOSIS — R079 Chest pain, unspecified: Secondary | ICD-10-CM

## 2014-07-19 DIAGNOSIS — K047 Periapical abscess without sinus: Secondary | ICD-10-CM

## 2014-07-19 DIAGNOSIS — Z87891 Personal history of nicotine dependence: Secondary | ICD-10-CM | POA: Insufficient documentation

## 2014-07-19 LAB — BASIC METABOLIC PANEL
Anion gap: 11 (ref 5–15)
BUN: 13 mg/dL (ref 6–23)
CHLORIDE: 104 meq/L (ref 96–112)
CO2: 23 meq/L (ref 19–32)
CREATININE: 0.87 mg/dL (ref 0.50–1.10)
Calcium: 9.5 mg/dL (ref 8.4–10.5)
GFR calc Af Amer: >90 mL/min (ref >90)
GFR calc non Af Amer: >90 mL/min (ref >90)
GLUCOSE: 89 mg/dL (ref 70–99)
Potassium: 4 mEq/L (ref 3.7–5.3)
Sodium: 138 mEq/L (ref 137–147)

## 2014-07-19 LAB — I-STAT TROPONIN, ED: Troponin i, poc: 0 ng/mL (ref 0.00–0.08)

## 2014-07-19 MED ORDER — PENICILLIN V POTASSIUM 500 MG PO TABS: 500.0000 mg | ORAL_TABLET | Freq: Four times a day (QID) | ORAL | Status: AC

## 2014-07-19 MED ORDER — KETOROLAC TROMETHAMINE 60 MG/2ML IM SOLN
60.0000 mg | Freq: Once | INTRAMUSCULAR | Status: AC
  Administered 2014-07-19: 60 mg via INTRAMUSCULAR
  Filled 2014-07-19: qty 2

## 2014-07-19 MED ORDER — KETOROLAC TROMETHAMINE 10 MG PO TABS: 10.0000 mg | ORAL_TABLET | Freq: Four times a day (QID) | ORAL | Status: DC | PRN

## 2014-07-19 MED ORDER — PENICILLIN V POTASSIUM 250 MG PO TABS
500.0000 mg | ORAL_TABLET | Freq: Once | ORAL | Status: AC
  Administered 2014-07-19: 500 mg via ORAL
  Filled 2014-07-19: qty 2

## 2014-07-19 NOTE — ED Notes (Signed)
Pt reports left side dental pain. Also having palpitations and left side chest pains that became more severe this am. No distress noted at triage.

## 2014-07-19 NOTE — ED Provider Notes (Signed)
CSN: [id number]     Arrival date & time 07/19/14  4196 History   First MD Initiated Contact with Patient 07/19/14 [phone]     Chief Complaint  Patient presents with  . Chest Pain  . Dental Pain     (Consider location/radiation/quality/duration/timing/severity/associated sxs/prior Treatment) HPI  Cindy Macias is a 28 y.o. female without significant PMH presenting with left-sided dental pain that she has had intermittently for the past couple of years. She states that she has had a headache associated with it as well as tum patient swelling lymph node swelling. Patient denies history of diabetes. No history of fever, chills. No nausea vomiting. No difficulty breathing or shortness of breath. Patient also has had intermittent palpitations. She says is just too quick beats. Met resolves. This happens at night while she is at rest. Yesterday she did have some sharp left-sided chest pains that lasted for 10 seconds and are completely resolved. She denied any radiation. No nausea vomiting, diaphoresis, shortness of breath. Patient without cardiac history. No family history of sudden cardiac death. Patient denies history of high pressure, high cholesterol level, diabetes. Patient does smoke. No history of DVT, PE, trauma or surgery, hemoptysis, unilateral leg swelling. Pt takes depo but no estrogens.    History reviewed. No pertinent past medical history. Past Surgical History  Procedure Laterality Date  . Cesarean section     History reviewed. No pertinent family history. History  Substance Use Topics  . Smoking status: Former Research scientist (life sciences)  . Smokeless tobacco: Not on file  . Alcohol Use: No   OB History    No data available     Review of Systems  Constitutional: Negative for fever and chills.  HENT: Negative for congestion and rhinorrhea.   Eyes: Negative for visual disturbance.  Respiratory: Negative for cough and shortness of breath.   Cardiovascular: Positive for palpitations. Negative  for chest pain.  Gastrointestinal: Negative for nausea, vomiting and diarrhea.  Musculoskeletal: Negative for back pain and gait problem.  Skin: Negative for rash.  Neurological: Negative for weakness and headaches.      Allergies  Review of patient's allergies indicates no known allergies.  Home Medications   Prior to Admission medications   Medication Sig Start Date End Date Taking? Authorizing Provider  ketorolac (TORADOL) 10 MG tablet Take 1 tablet (10 mg total) by mouth every 6 (six) hours as needed. 07/19/14   Pura Spice, PA-C  metoCLOPramide (REGLAN) 5 MG tablet Take 1 tablet (5 mg total) by mouth every 6 (six) hours as needed for nausea or vomiting. Patient not taking: Reported on 07/19/2014 03/19/14   Francine Graven, DO  penicillin v potassium (VEETID) 500 MG tablet Take 1 tablet (500 mg total) by mouth 4 (four) times daily. 07/19/14 07/26/14  Pura Spice, PA-C  Prenatal Vit-Fe Fumarate-FA (PRENATAL COMPLETE) 14-0.4 MG TABS Take 1 tablet by mouth daily. Patient not taking: Reported on 07/19/2014 03/19/14   Francine Graven, DO   BP 116/67 mmHg  Pulse 53  Temp(Src) 98.4 F (36.9 C) (Oral)  Resp 18  SpO2 100% Physical Exam  Constitutional: She appears well-developed and well-nourished. No distress.  HENT:  Head: Normocephalic and atraumatic.  No trismus or uvula deviation. Patient handling secretions. Patient with left lower molar with significant decay. No other dental abnormalities. Mild gingival erythema with no discrete abscess. No lip, face, tongue swelling. No tenderness under tongue or swelling. Patient without neck masses. Patient with tender lymphadenopathy on left.  Eyes: Conjunctivae and EOM  are normal. Right eye exhibits no discharge. Left eye exhibits no discharge.  Neck: Normal range of motion. Neck supple. No JVD present.  Cardiovascular: Normal rate, regular rhythm and normal heart sounds.   No leg swelling or tenderness. Negative Homan's sign.   Pulmonary/Chest: Effort normal and breath sounds normal. No respiratory distress. She has no wheezes.  Abdominal: Soft. Bowel sounds are normal. She exhibits no distension. There is no tenderness.  Neurological: She is alert. She exhibits normal muscle tone. Coordination normal.  Skin: Skin is warm and dry. She is not diaphoretic.  Nursing note and vitals reviewed.   ED Course  Procedures (including critical care time) Labs Review [place], ED    Imaging Review Dg Chest 2 View  07/19/2014   CLINICAL DATA:  Chest pressure/ pain for 3 days.  EXAM: CHEST  2 VIEW  COMPARISON:  07/28/2013  FINDINGS: The cardiomediastinal contours are normal. The lungs are clear. Pulmonary vasculature is normal. No consolidation, pleural effusion, or pneumothorax. No acute osseous abnormalities are seen.  IMPRESSION: Clear lungs.   Electronically Signed   By: Jeb Levering M.D.   On: 07/19/2014 11:16     EKG Interpretation   Date/Time:  Monday July 19 2014 09:44:58 EST Ventricular Rate:  64 PR Interval:  158 QRS Duration: 82 QT Interval:  382 QTC Calculation: 394 R Axis:   76 Text Interpretation:  Normal sinus rhythm Normal ECG Confirmed by BEATON   MD, ROBERT ([id number]) on 07/19/2014 10:01:09 AM      MDM   Final diagnoses:  Chest pain  Dental infection   Chest pain is not likely of cardiac or pulmonary etiology d/t presentation, PERC negative, low risk HEART score. VSS, no tracheal deviation, no JVD or new murmur, RRR, breath sounds equal bilaterally, EKG without acute abnormalities, negative troponin, and negative CXR. Pt has been advised to return to the ED if CP becomes exertional, associated with diaphoresis or nausea, radiates to left jaw/arm, worsens or becomes concerning in any way.   Pt also with dental pain. No gross abscess.  No history of diabetes. Exam unconcerning for Ludwig's angina or spread of infection.  Will treat with  penicillin and pain medicine.  Driving and sedation precautions provided. Urged patient to follow-up with dentist.  ED resources provided. Pt to follow up with PCP. Pt appears reliable for follow up and is agreeable to discharge.   Discussed return precautions with patient. Discussed all results and patient verbalizes understanding and agrees with plan.  Case has been discussed with Dr. Audie Pinto who agrees with the above plan and to discharge.        Pura Spice, PA-C 07/19/14 1207  Dot Lanes, MD 07/26/14 [phone]

## 2014-07-19 NOTE — Discharge Instructions (Signed)
Return to the emergency room with worsening of symptoms, new symptoms or with symptoms that are concerning, especially fevers, unable to open mouth, swelling of face, lips, tongue or tenderness under tongue OR chest pain that feels like a pressure, spreads to left arm or jaw, worse with exertion, associated with nausea, vomiting, shortness of breath and/or sweating.  Please take all of your antibiotics until finished!   You may develop abdominal discomfort or diarrhea from the antibiotic.  You may help offset this with probiotics which you can buy or get in yogurt. Do not eat  or take the probiotics until 2 hours after your antibiotic.  Take toradol with food because it can upset your stomach. Call or make appointment with the dentist as soon as possible. Use below resources to establish care.    Emergency Department Resource Guide 1) Find a Doctor and Pay Out of Pocket Although you won't have to find out who is covered by your insurance plan, it is a good idea to ask around and get recommendations. You will then need to call the office and see if the doctor you have chosen will accept you as a new patient and what types of options they offer for patients who are self-pay. Some doctors offer discounts or will set up payment plans for their patients who do not have insurance, but you will need to ask so you aren't surprised when you get to your appointment.  2) Contact Your Local Health Department Not all health departments have doctors that can see patients for sick visits, but many do, so it is worth a call to see if yours does. If you don't know where your local health department is, you can check in your phone book. The CDC also has a tool to help you locate your state's health department, and many state websites also have listings of all of their local health departments.  3) Find a Walk-in Clinic If your illness is not likely to be very severe or complicated, you may want to try a walk in clinic.  These are popping up all over the country in pharmacies, drugstores, and shopping centers. They're usually staffed by nurse practitioners or physician assistants that have been trained to treat common illnesses and complaints. They're usually fairly quick and inexpensive. However, if you have serious medical issues or chronic medical problems, these are probably not your best option.  No Primary Care Doctor: - Call Health Connect at  [phone] - they can help you locate a primary care doctor that  accepts your insurance, provides certain services, etc. - Physician Referral Service- [phone]  Chronic Pain Problems: Organization         Address  Phone   Notes  [facility]  [phone] Patients need to be referred by their primary care doctor.   Medication Assistance: Organization         Address  Phone   Notes  [facility] Medication [facility] [address] [place] [place], [place] [address] [phone] --Must be a resident of [facility] -- Must have NO insurance coverage whatsoever (no Medicaid/ Medicare, etc.) -- The pt. MUST have a primary care doctor that directs their care regularly and follows them in the community   [facility]ist  [phone]   [employer]  [phone]    Agencies that provide inexpensive medical care: Organization         Address  Phone   Notes  [facility]  [phone]   [facility]    [phone]   [facility] [address] [place], [place] [address] [phone]   [facility] [address], [place] [phone]   Planned Parenthood    [phone]   [facility]    [phone]   [facility]  [address], [place] Phone:  [phone], Fax:  [phone] Hours of Operation:  9 am - 6 pm, M-F.  Also accepts Medicaid/Medicare and self-pay.  [facility]  [address], [place] Phone: [phone], Fax: [phone]. Hours of Operation:  8:30 am - 5:30 pm, M-F.  Also accepts Medicaid and self-pay.  [facility] [place] [address], [place] Phone: [phone]   [facility] [address] Natasha Bence [place], [place] [phone], Ext. [phone] Mondays & Thursdays: 7-9 AM.  First 15 patients are seen on a first come, first serve basis.    Medicaid-accepting [facility] Providers:  Organization         Address  Phone   Notes  [facility] [address], [place] [phone] Also accepts self-pay patients.  [facility] [address] Laurell Josephs [place], [place]  [phone]   [facility] [address], [place] [phone]   [facility] [address], [place] [phone]   Renaye Rakers [address], [place]   [phone] Only accepts [place] patients after they have their name applied to their card.   Self-Pay (no insurance) in [facility]:  Organization         Address  Phone   Notes  Sickle Cell Patients, [facility] [address] [place], [place] [phone]   [facility] [address] [place], [place] [phone]   [facility]  [address], [place] [phone]   [facility]/Dr. Osei-Bonsu  [address] [place] or [address], [place] [phone] Phone number for both [place] and [place] locations is the same.  [facility] [address], [place] [phone]   [facility] [address] [place] or [address] [phone]   [facility] [address] [place] [phone], phone; [phone], fax Sees patients 1st  and 3rd Saturday of every month.  Must not qualify for public or private insurance (i.e. Medicaid, Medicare, [place] Health Choice, Veterans' Benefits)  Household income should be no more than 200% of the poverty level The clinic cannot treat you if you are pregnant or think you are pregnant  Sexually transmitted diseases are not treated at the clinic.    Dental Care: Organization         Address  Phone  Notes  [facility] Department of [facility] [address] [place], [place] [phone] Accepts children up to age 71 who are enrolled in [place] or [place] Health Choice; pregnant women with a Medicaid card; and children who have applied for Medicaid or [place] Health Choice, but were declined, whose parents can pay a reduced fee at time of service.  [facility] Department of [employer]  [place]  [address], [place] [phone]) [phone] Accepts children up to age 27 who are enrolled in Medicaid or [place] Health Choice; pregnant women with a Medicaid card; and children who have applied for Medicaid or [place] Health Choice, but were declined, whose parents can pay a reduced fee at time of service.  [facility]  [address], [place] [phone] Patients are seen by appointment only. Walk-ins are not accepted. [facility] will see patients 27 years of age and older. Monday - Tuesday (8am-5pm) Most Wednesdays (8:30-5pm) $30 per visit, cash only  [facility]  [address], [facility] [place] [phone] Patients are seen by appointment only. Walk-ins are not accepted. [facility] will see patients 27 years of age and older. One Wednesday Evening (Monthly: Volunteer Based).  $30 per visit, cash only  [facility] [facility]  [phone] for adults; Children under age 834, call Graduate Pediatric Dentistry at [phone]. Children aged 624-14, please call [phone] to request a pediatric  application.  Dental services are provided in all areas of dental care including fillings, crowns and bridges, complete and partial dentures, implants, gum treatment, root canals, and extractions. Preventive care is also provided. Treatment is provided to both adults and children. Patients are selected via a lottery and there is often a waiting list.   [facility] [address], [place]  [phone]) [phone] [email or web address]   [facility] [address], [place], [place] [phone], Ext. [phone] Second and Fourth Thursday of each month, opens at 6:30 AM; Clinic ends at 9 AM.  Patients are seen on a first-come first-served basis, and a limited number are seen during each clinic.   [facility]  [address], [place], [place] [phone]   Eligibility Requirements You must have lived in [place], [place], or [place] for at least the last three months.   You cannot be eligible for state or federal sponsored National Cityhealthcare insurance, including CIGNAVeterans Administration, [place]icaid, or [employer]icare.   You generally cannot be eligible for healthcare insurance through your employer.    How to apply: Eligibility screenings are held every Tuesday and Wednesday afternoon from 1:00 pm until 4:00 pm. You do not need an appointment for the interview!  [facility] [address], [place], [place] [phone]   [facility]  [phone]   [facility]  [phone]   [facility]  [phone]    Behavioral Health Resources in the Community: Intensive Outpatient Programs Organization         Address  Phone  Notes  [facility] [address], [place], [place] [phone]   [facility] [address], [place], [place] [phone]   [facility] [address], [place], [place]  [phone]   [facility]  [address],  [place], [place] [phone] or [phone]   Substance Abuse Resources Organization         Address  Phone  Notes  [facility]  [phone]   [facility]  [phone]   [facility]  [phone]   [facility]  [phone]   Residential & Outpatient Substance Abuse Program  [phone]   Psychological Services Organization         Address  Phone  Notes  [facility]  [phone]- [phone]  [facility]  [phone]- [phone]   [facility] [address], [place] [phone] or [phone]    Mobile Crisis Teams Organization         Address  Phone  Notes  [facility], Mobile Crisis Care Unit  [phone]   [facility]  [address] [place], [place] [phone]   Doristine LocksSharon DeEsch [address] [place] [phone]    Self-Help/Support Groups Organization         Address  Phone             Notes  Mental Health Assoc. of  - variety of support groups  336- [phone] Call for more information  Narcotics Anonymous (NA), [facility] [address], [place]  2 meetings at this location   Statisticianesidential Treatment Programs Organization         Address  Phone  Notes  [facility] [address],    [place]  [phone]   [facility]  [address], [place] [phone], [place], [place] [phone]   [facility] [address], [place] [phone] Admissions: 8am-3pm M-F  [facility] [address]    [place], [place] [phone]   [facility] [address], [place], [place] [phone]   [facility] [address]  [place], [place] [phone]   [facility] - Intensive Outpatient [address], [place], [place] [phone]   [facility] [address]    [place], [place] [phone] or [phone]   [facility] ([facility]) [address] [place], [place] [phone] Accepts Medicaid  [facility] [address]  [place] [phone] Substance Abuse/Addiction Treatment   [facility] Resources Organization         Address  Phone  Notes  [facility]  [phone]) [phone]   Angie FavaJulie Brannon, PhD [address], [address] [place], [place]   [phone] or [phone]   [facility]   [address] [place], [place] [phone]   [facility] [address], [place], [place] [phone]) [phone] Insurance/Medicaid/sponsorship through [facility]  [facility] [address]                                    [place], [place] [phone](336) [phone] Therapy/tele-psych/case  [facility] [address]   [place], [place] [phone]    Dr. Lolly MustacheArfeen  [phone]   [facility]  [employer] [facility] 1) [address], [place] 2) [address], [place] 3)  [address], [place] [phone]  [phone]   [facility] Child Abuse Hotline [phone]) [phone] or [phone]) [phone] (After Hours)

## 2016-01-14 ENCOUNTER — Emergency Department (HOSPITAL_COMMUNITY): Payer: Self-pay

## 2016-01-14 ENCOUNTER — Encounter (HOSPITAL_COMMUNITY): Payer: Self-pay | Admitting: Emergency Medicine

## 2016-01-14 ENCOUNTER — Emergency Department (HOSPITAL_COMMUNITY)
Admission: EM | Admit: 2016-01-14 | Discharge: 2016-01-14 | Disposition: A | Discharge status: Home / Self Care | Payer: Self-pay | Attending: Emergency Medicine | Admitting: Emergency Medicine

## 2016-01-14 DIAGNOSIS — R102 Pelvic and perineal pain: Secondary | ICD-10-CM

## 2016-01-14 DIAGNOSIS — B9689 Other specified bacterial agents as the cause of diseases classified elsewhere: Secondary | ICD-10-CM

## 2016-01-14 DIAGNOSIS — O23591 Infection of other part of genital tract in pregnancy, first trimester: Secondary | ICD-10-CM | POA: Insufficient documentation

## 2016-01-14 DIAGNOSIS — Z87891 Personal history of nicotine dependence: Secondary | ICD-10-CM | POA: Insufficient documentation

## 2016-01-14 DIAGNOSIS — O200 Threatened abortion: Secondary | ICD-10-CM | POA: Insufficient documentation

## 2016-01-14 DIAGNOSIS — N760 Acute vaginitis: Secondary | ICD-10-CM

## 2016-01-14 DIAGNOSIS — O26899 Other specified pregnancy related conditions, unspecified trimester: Secondary | ICD-10-CM

## 2016-01-14 DIAGNOSIS — O209 Hemorrhage in early pregnancy, unspecified: Secondary | ICD-10-CM

## 2016-01-14 DIAGNOSIS — Z3A01 Less than 8 weeks gestation of pregnancy: Secondary | ICD-10-CM | POA: Insufficient documentation

## 2016-01-14 LAB — CBC WITH DIFFERENTIAL/PLATELET
Basophils Absolute: 0 10*3/uL (ref 0.0–0.1)
Basophils Relative: 0 %
EOS ABS: 0.1 10*3/uL (ref 0.0–0.7)
EOS PCT: 1 %
HCT: 34.3 % — ABNORMAL LOW (ref 36.0–46.0)
Hemoglobin: 11.7 g/dL — ABNORMAL LOW (ref 12.0–15.0)
LYMPHS ABS: 2 10*3/uL (ref 0.7–4.0)
LYMPHS PCT: 38 %
MCH: 27.5 pg (ref 26.0–34.0)
MCHC: 34.1 g/dL (ref 30.0–36.0)
MCV: 80.5 fL (ref 78.0–100.0)
MONO ABS: 0.3 10*3/uL (ref 0.1–1.0)
Monocytes Relative: 5 %
Neutro Abs: 3 10*3/uL (ref 1.7–7.7)
Neutrophils Relative %: 56 %
PLATELETS: 154 10*3/uL (ref 150–400)
RBC: 4.26 MIL/uL (ref 3.87–5.11)
RDW: 13.4 % (ref 11.5–15.5)
WBC: 5.3 10*3/uL (ref 4.0–10.5)

## 2016-01-14 LAB — BASIC METABOLIC PANEL
Anion gap: 7 (ref 5–15)
BUN: 15 mg/dL (ref 6–20)
CALCIUM: 8.9 mg/dL (ref 8.9–10.3)
CO2: 21 mmol/L — ABNORMAL LOW (ref 22–32)
CREATININE: 0.68 mg/dL (ref 0.44–1.00)
Chloride: 106 mmol/L (ref 101–111)
GFR calc Af Amer: >60 mL/min (ref >60)
GLUCOSE: 97 mg/dL (ref 65–99)
POTASSIUM: 3.9 mmol/L (ref 3.5–5.1)
SODIUM: 134 mmol/L — AB (ref 135–145)

## 2016-01-14 LAB — WET PREP, GENITAL
SPERM: NONE SEEN
Trich, Wet Prep: NONE SEEN
Yeast Wet Prep HPF POC: NONE SEEN

## 2016-01-14 LAB — ABO/RH: ABO/RH(D): O POS

## 2016-01-14 LAB — HCG, QUANTITATIVE, PREGNANCY: hCG, Beta Chain, Quant, S: 101062 m[IU]/mL — ABNORMAL HIGH (ref <5)

## 2016-01-14 LAB — POC URINE PREG, ED: Preg Test, Ur: POSITIVE — AB

## 2016-01-14 MED ORDER — METRONIDAZOLE 250 MG PO TABS: 250.0000 mg | ORAL_TABLET | Freq: Three times a day (TID) | ORAL | Status: DC

## 2016-01-14 NOTE — ED Provider Notes (Signed)
CSN: [id number]     Arrival date & time 01/14/16  0054 History   First MD Initiated Contact with Patient 01/14/16 0241     Chief Complaint  Patient presents with  . Possible Pregnancy  . Vaginal Bleeding     (Consider location/radiation/quality/duration/timing/severity/associated sxs/prior Treatment) HPI Comments: Patient presents with vaginal bleeding for the past 5 days that started as minimal spotting and is now heavy flow and associated with abdominal cramping. She reports a positive pregnancy test at home with a LMP in April. [id number] with 3 uncomplicated full-term pregnancies. She denies vaginal discharge prior to or associated with bleeding. She reports she is now passing clots. No back pain, dysuria, or fever.   Patient is a 30 y.o. female presenting with pregnancy problem and vaginal bleeding. The history is provided by the patient. No language interpreter was used.  Possible Pregnancy Associated symptoms include abdominal pain and nausea. Pertinent negatives include no chest pain, coughing, fever, vomiting or weakness.  Vaginal Bleeding Associated symptoms: abdominal pain and nausea   Associated symptoms: no back pain, no dysuria, no fever and no vaginal discharge     History reviewed. No pertinent past medical history. Past Surgical History  Procedure Laterality Date  . Cesarean section     No family history on file. Social History  Substance Use Topics  . Smoking status: Former Games developer  . Smokeless tobacco: None  . Alcohol Use: No   OB History    No data available     Review of Systems  Constitutional: Negative for fever.  Respiratory: Negative.  Negative for cough and shortness of breath.   Cardiovascular: Negative.  Negative for chest pain.  Gastrointestinal: Positive for nausea and abdominal pain. Negative for vomiting.  Genitourinary: Positive for vaginal bleeding and pelvic pain. Negative for dysuria and vaginal discharge.  Musculoskeletal: Negative.  Negative  for back pain.  Skin: Negative.  Negative for color change.  Neurological: Negative.  Negative for weakness.      Allergies  Review of patient's allergies indicates no known allergies.  Home Medications   Prior to Admission medications   Medication Sig Start Date End Date Taking? Authorizing Provider  metoCLOPramide (REGLAN) 5 MG tablet Take 1 tablet (5 mg total) by mouth every 6 (six) hours as needed for nausea or vomiting. Patient not taking: Reported on 07/19/2014 03/19/14   Samuel Jester, DO  Prenatal Vit-Fe Fumarate-FA (PRENATAL COMPLETE) 14-0.4 MG TABS Take 1 tablet by mouth daily. Patient not taking: Reported on 07/19/2014 03/19/14   Samuel Jester, DO   BP 126/74 mmHg  Pulse 83  Temp(Src) 98.2 F (36.8 C) (Oral)  Resp 18  Ht 5\' 2"  (1.575 m)  Wt 79.379 kg  BMI 32.00 kg/m2  SpO2 97%  LMP 12/20/2015 (Approximate) Physical Exam  Constitutional: She is oriented to person, place, and time. She appears well-developed and well-nourished.  HENT:  Head: Normocephalic.  Neck: Normal range of motion. Neck supple.  Cardiovascular: Normal rate.   Pulmonary/Chest: Effort normal.  Abdominal: Soft. Bowel sounds are normal. There is no tenderness. There is no rebound and no guarding.  Genitourinary:  CMT and left adnexal tenderness. There is cervical bleeding present without evidence of POC.   Musculoskeletal: Normal range of motion.  Neurological: She is alert and oriented to person, place, and time.  Skin: Skin is warm and dry. No rash noted.  Psychiatric: She has a normal mood and affect.    ED Course  Procedures (including critical care time) Labs Review Labs Reviewed  WET PREP, GENITAL - Abnormal; Notable for the following:    Clue Cells Wet Prep HPF POC PRESENT (*)    WBC, Wet Prep HPF POC MODERATE (*)    All other components within normal limits  POC URINE PREG, ED - Abnormal; Notable for the following:    Preg Test, Ur POSITIVE (*)    All other components  within normal limits  CBC WITH DIFFERENTIAL/PLATELET  BASIC METABOLIC PANEL  HCG, QUANTITATIVE, PREGNANCY  ABO/RH  GC/CHLAMYDIA PROBE AMP ([place]) NOT AT [facility]   Results for orders placed or performed during the hospital encounter of 01/14/16  Wet prep, genital  Result Value Ref Range   Yeast Wet Prep HPF POC NONE SEEN NONE SEEN   Trich, Wet Prep NONE SEEN NONE SEEN   Clue Cells Wet Prep HPF POC PRESENT (A) NONE SEEN   WBC, Wet Prep HPF POC MODERATE (A) NONE SEEN   Sperm NONE SEEN   CBC with Differential  Result Value Ref Range   WBC 5.3 4.0 - 10.5 K/uL   RBC 4.26 3.87 - 5.11 MIL/uL   Hemoglobin 11.7 (L) 12.0 - 15.0 g/dL   HCT 69.6 (L) 29.5 - 28.4 %   MCV 80.5 78.0 - 100.0 fL   MCH 27.5 26.0 - 34.0 pg   MCHC 34.1 30.0 - 36.0 g/dL   RDW 13.2 44.0 - 10.2 %   Platelets 154 150 - 400 K/uL   Neutrophils Relative % 56 %   Neutro Abs 3.0 1.7 - 7.7 K/uL   Lymphocytes Relative 38 %   Lymphs Abs 2.0 0.7 - 4.0 K/uL   Monocytes Relative 5 %   Monocytes Absolute 0.3 0.1 - 1.0 K/uL   Eosinophils Relative 1 %   Eosinophils Absolute 0.1 0.0 - 0.7 K/uL   Basophils Relative 0 %   Basophils Absolute 0.0 0.0 - 0.1 K/uL  Basic metabolic panel  Result Value Ref Range   Sodium 134 (L) 135 - 145 mmol/L   Potassium 3.9 3.5 - 5.1 mmol/L   Chloride 106 101 - 111 mmol/L   CO2 21 (L) 22 - 32 mmol/L   Glucose, Bld 97 65 - 99 mg/dL   BUN 15 6 - 20 mg/dL   Creatinine, Ser 7.25 0.44 - 1.00 mg/dL   Calcium 8.9 8.9 - 36.6 mg/dL   GFR calc non Af Amer >60 >60 mL/min   GFR calc Af Amer >60 >60 mL/min   Anion gap 7 5 - 15  hCG, quantitative, pregnancy  Result Value Ref Range   hCG, Beta Chain, Quant, S 101062 (H) <5 mIU/mL  POC urine preg, ED  Result Value Ref Range   Preg Test, Ur POSITIVE (A) NEGATIVE  ABO/Rh  Result Value Ref Range   ABO/RH(D) O POS    US Ob Comp Less 14 Wks  01/14/2016  CLINICAL DATA:  Pelvic pain and vaginal bleeding. Estimated gestational age by LMP is 10 weeks 6  days. Quantitative beta HCG is 101,602. EXAM: OBSTETRIC <14 WK [place] AND TRANSVAGINAL OB US TECHNIQUE: Both transabdominal and transvaginal ultrasound examinations were performed for complete evaluation of the gestation as well as the maternal uterus, adnexal regions, and pelvic cul-de-sac. Transvaginal technique was performed to assess early pregnancy. COMPARISON:  None. FINDINGS: Intrauterine gestational sac: A single intrauterine gestational sac is identified. The sac is somewhat irregular in shape and is positioned somewhat inferiorly within the endometrium. Internal echoes are present. Yolk sac:  Yolk sac is not identified. Embryo:  Possible tiny  fetal pole identified. Cardiac Activity: Possible cardiac activity visualized, appears slow and unable to measure. Heart Rate: Not able to measure. CRL:  6  mm   6 w   2 d                  US EDC: 09/16/2016 Subchorionic hemorrhage: A small subchorionic hemorrhage is demonstrated superiorly and posteriorly with respect to the gestational sac and measures about 1.6 x 0.9 x 2.2 cm. Maternal uterus/adnexae: Uterus is anteverted. No myometrial mass lesions identified. Cervix is unremarkable. Uterus measures 11.4 x 5.9 x 7.4 cm. Both ovaries are visualized and appear normal. Complex cyst in the left ovary measuring about 1.8 cm diameter probably representing corpus luteum. No free fluid in the pelvis. IMPRESSION: Abnormal shaped intrauterine gestational sac is demonstrated within the inferior uterine fundus. A fetal pole is identified with fetal cardiac activity observed but unable to be measured. Yolk sac is not visualized. Small subchorionic hemorrhage. Close clinical monitoring is recommended in the patient may benefit from follow-up ultrasound in 10-14 days for correlation. Electronically Signed   By: Burman NievesWilliam  Stevens M.D.   On: 01/14/2016 05:32   [place] Ob Transvaginal  01/14/2016  CLINICAL DATA:  Pelvic pain and vaginal bleeding. Estimated gestational age by LMP is 10  weeks 6 days. Quantitative beta HCG is 101,602. EXAM: OBSTETRIC <14 WK US AND TRANSVAGINAL OB US TECHNIQUE: Both transabdominal and transvaginal ultrasound examinations were performed for complete evaluation of the gestation as well as the maternal uterus, adnexal regions, and pelvic cul-de-sac. Transvaginal technique was performed to assess early pregnancy. COMPARISON:  None. FINDINGS: Intrauterine gestational sac: A single intrauterine gestational sac is identified. The sac is somewhat irregular in shape and is positioned somewhat inferiorly within the endometrium. Internal echoes are present. Yolk sac:  Yolk sac is not identified. Embryo:  Possible tiny fetal pole identified. Cardiac Activity: Possible cardiac activity visualized, appears slow and unable to measure. Heart Rate: Not able to measure. CRL:  6  mm   6 w   2 d                  US EDC: 09/16/2016 Subchorionic hemorrhage: A small subchorionic hemorrhage is demonstrated superiorly and posteriorly with respect to the gestational sac and measures about 1.6 x 0.9 x 2.2 cm. Maternal uterus/adnexae: Uterus is anteverted. No myometrial mass lesions identified. Cervix is unremarkable. Uterus measures 11.4 x 5.9 x 7.4 cm. Both ovaries are visualized and appear normal. Complex cyst in the left ovary measuring about 1.8 cm diameter probably representing corpus luteum. No free fluid in the pelvis. IMPRESSION: Abnormal shaped intrauterine gestational sac is demonstrated within the inferior uterine fundus. A fetal pole is identified with fetal cardiac activity observed but unable to be measured. Yolk sac is not visualized. Small subchorionic hemorrhage. Close clinical monitoring is recommended in the patient may benefit from follow-up ultrasound in 10-14 days for correlation. Electronically Signed   By: Burman NievesWilliam  Stevens M.D.   On: 01/14/2016 05:32    Imaging Review No results found. I have personally reviewed and evaluated these images and lab results as part of  my medical decision-making.   EKG Interpretation None      MDM   Final diagnoses:  None    1. Threatened miscarriage 2. BV  Patient presents with positive pregnancy test at home with onset vaginal bleeding 1 week ago associated with abdominal cramping. US shows a 6 w 2 d IUP with cardiac activity. Labs show clue cells -  will treat with Flagyl. REfer to OB/GYN for close follow up with repeat ultrasound.     Elpidio Anis, PA-C 01/14/16 1610  Geoffery Lyons, MD 01/14/16 [phone]

## 2016-01-14 NOTE — ED Notes (Signed)
Pt states about a week ago she took two home pregnancy tests which showed positive results. Pt states day after she took the test she has been bleeding enough to fill up pads and has had some clots. Positive for lower abdominal pain and some nausea.

## 2016-01-14 NOTE — Discharge Instructions (Signed)
Bacterial Vaginosis °Bacterial vaginosis is a vaginal infection that occurs when the normal balance of bacteria in the vagina is disrupted. It results from an overgrowth of certain bacteria. This is the most common vaginal infection in women of childbearing age. Treatment is important to prevent complications, especially in pregnant women, as it can cause a premature delivery. °CAUSES  °Bacterial vaginosis is caused by an increase in harmful bacteria that are normally present in smaller amounts in the vagina. Several different kinds of bacteria can cause bacterial vaginosis. However, the reason that the condition develops is not fully understood. °RISK FACTORS °Certain activities or behaviors can put you at an increased risk of developing bacterial vaginosis, including: °· Having a new sex partner or multiple sex partners. °· Douching. °· Using an intrauterine device (IUD) for contraception. °Women do not get bacterial vaginosis from toilet seats, bedding, swimming pools, or contact with objects around them. °SIGNS AND SYMPTOMS  °Some women with bacterial vaginosis have no signs or symptoms. Common symptoms include: °· Grey vaginal discharge. °· A fishlike odor with discharge, especially after sexual intercourse. °· Itching or burning of the vagina and vulva. °· Burning or pain with urination. °DIAGNOSIS  °Your health care provider will take a medical history and examine the vagina for signs of bacterial vaginosis. A sample of vaginal fluid may be taken. Your health care provider will look at this sample under a microscope to check for bacteria and abnormal cells. A vaginal pH test may also be done.  °TREATMENT  °Bacterial vaginosis may be treated with antibiotic medicines. These may be given in the form of a pill or a vaginal cream. A second round of antibiotics may be prescribed if the condition comes back after treatment. Because bacterial vaginosis increases your risk for sexually transmitted diseases, getting  treated can help reduce your risk for chlamydia, gonorrhea, HIV, and herpes. °HOME CARE INSTRUCTIONS  °· Only take over-the-counter or prescription medicines as directed by your health care provider. °· If antibiotic medicine was prescribed, take it as directed. Make sure you finish it even if you start to feel better. °· Tell all sexual partners that you have a vaginal infection. They should see their health care provider and be treated if they have problems, such as a mild rash or itching. °· During treatment, it is important that you follow these instructions: °· Avoid sexual activity or use condoms correctly. °· Do not douche. °· Avoid alcohol as directed by your health care provider. °· Avoid breastfeeding as directed by your health care provider. °SEEK MEDICAL CARE IF:  °· Your symptoms are not improving after 3 days of treatment. °· You have increased discharge or pain. °· You have a fever. °MAKE SURE YOU:  °· Understand these instructions. °· Will watch your condition. °· Will get help right away if you are not doing well or get worse. °FOR MORE INFORMATION  °Centers for Disease Control and Prevention, Division of STD Prevention: www.cdc.gov/std °American Sexual Health Association (ASHA): www.ashastd.org  °  °This information is not intended to replace advice given to you by your health care provider. Make sure you discuss any questions you have with your health care provider. °  °Document Released: 07/16/2005 Document Revised: 08/06/2014 Document Reviewed: 02/25/2013 °Elsevier Interactive Patient Education ©2016 [employer] Inc[employer] ° °Threatened Miscarriage °A threatened miscarriage occurs when you have vaginal bleeding during your first 20 weeks of pregnancy but the pregnancy has not ended. If you have vaginal bleeding during this time, your health care provider   do tests to make sure you are still pregnant. If the tests show you are still pregnant and the developing baby (fetus) inside your womb (uterus) is  still growing, your condition is considered a threatened miscarriage. A threatened miscarriage does not mean your pregnancy will end, but it does increase the risk of losing your pregnancy (complete miscarriage). CAUSES  The cause of a threatened miscarriage is usually not known. If you go on to have a complete miscarriage, the most common cause is an abnormal number of chromosomes in the developing baby. Chromosomes are the structures inside cells that hold all your genetic material. Some causes of vaginal bleeding that do not result in miscarriage include:  Having sex.  Having an infection.  Normal hormone changes of pregnancy.  Bleeding that occurs when an egg implants in your uterus. RISK FACTORS Risk factors for bleeding in early pregnancy include:  Obesity.  Smoking.  Drinking excessive amounts of alcohol or caffeine.  Recreational drug use. SIGNS AND SYMPTOMS  Light vaginal bleeding.  Mild abdominal pain or cramps. DIAGNOSIS  If you have bleeding with or without abdominal pain before 20 weeks of pregnancy, your health care provider will do tests to check whether you are still pregnant. One important test involves using sound waves and a computer (ultrasound) to create images of the inside of your uterus. Other tests include an internal exam of your vagina and uterus (pelvic exam) and measurement of your baby's heart rate.  You may be diagnosed with a threatened miscarriage if:  Ultrasound testing shows you are still pregnant.  Your baby's heart rate is strong.  A pelvic exam shows that the opening between your uterus and your vagina (cervix) is closed.  Your heart rate and blood pressure are stable.  Blood tests confirm you are still pregnant. TREATMENT  No treatments have been shown to prevent a threatened miscarriage from going on to a complete miscarriage. However, the right home care is important.  HOME CARE INSTRUCTIONS   Make sure you keep all your  appointments for prenatal care. This is very important.  Get plenty of rest.  Do not have sex or use tampons if you have vaginal bleeding.  Do not douche.  Do not smoke or use recreational drugs.  Do not drink alcohol.  Avoid caffeine. SEEK MEDICAL CARE IF:  You have light vaginal bleeding or spotting while pregnant.  You have abdominal pain or cramping.  You have a fever. SEEK IMMEDIATE MEDICAL CARE IF:  You have heavy vaginal bleeding.  You have blood clots coming from your vagina.  You have severe low back pain or abdominal cramps.  You have fever, chills, and severe abdominal pain. MAKE SURE YOU:  Understand these instructions.  Will watch your condition.  Will get help right away if you are not doing well or get worse.   This information is not intended to replace advice given to you by your health care provider. Make sure you discuss any questions you have with your health care provider.   Document Released: 07/16/2005 Document Revised: 07/21/2013 Document Reviewed: 05/12/2013 Elsevier Interactive Patient Education [employer]2016 [employer].

## 2016-01-16 LAB — GC/CHLAMYDIA PROBE AMP (~~LOC~~) NOT AT ARMC
CHLAMYDIA, DNA PROBE: NEGATIVE
Neisseria Gonorrhea: NEGATIVE

## 2016-02-03 ENCOUNTER — Encounter (HOSPITAL_COMMUNITY): Payer: Self-pay

## 2016-02-03 ENCOUNTER — Inpatient Hospital Stay (HOSPITAL_COMMUNITY): Payer: Self-pay

## 2016-02-03 ENCOUNTER — Inpatient Hospital Stay (HOSPITAL_COMMUNITY)
Admission: AD | Admit: 2016-02-03 | Discharge: 2016-02-04 | Disposition: A | Discharge status: Home / Self Care | Payer: Self-pay | Source: Ambulatory Visit | Attending: Obstetrics & Gynecology | Admitting: Obstetrics & Gynecology

## 2016-02-03 DIAGNOSIS — M62830 Muscle spasm of back: Secondary | ICD-10-CM | POA: Insufficient documentation

## 2016-02-03 DIAGNOSIS — O99331 Smoking (tobacco) complicating pregnancy, first trimester: Secondary | ICD-10-CM | POA: Insufficient documentation

## 2016-02-03 DIAGNOSIS — O021 Missed abortion: Secondary | ICD-10-CM | POA: Insufficient documentation

## 2016-02-03 DIAGNOSIS — Z3A09 9 weeks gestation of pregnancy: Secondary | ICD-10-CM | POA: Insufficient documentation

## 2016-02-03 DIAGNOSIS — O209 Hemorrhage in early pregnancy, unspecified: Secondary | ICD-10-CM

## 2016-02-03 DIAGNOSIS — R109 Unspecified abdominal pain: Secondary | ICD-10-CM

## 2016-02-03 DIAGNOSIS — O26899 Other specified pregnancy related conditions, unspecified trimester: Secondary | ICD-10-CM

## 2016-02-03 DIAGNOSIS — F17200 Nicotine dependence, unspecified, uncomplicated: Secondary | ICD-10-CM | POA: Insufficient documentation

## 2016-02-03 LAB — URINALYSIS, ROUTINE W REFLEX MICROSCOPIC
BILIRUBIN URINE: NEGATIVE
Glucose, UA: NEGATIVE mg/dL
Ketones, ur: NEGATIVE mg/dL
Leukocytes, UA: NEGATIVE
Nitrite: NEGATIVE
PH: 6 (ref 5.0–8.0)
Protein, ur: NEGATIVE mg/dL
SPECIFIC GRAVITY, URINE: 1.02 (ref 1.005–1.030)

## 2016-02-03 LAB — CBC
HCT: 35.2 % — ABNORMAL LOW (ref 36.0–46.0)
Hemoglobin: 11.9 g/dL — ABNORMAL LOW (ref 12.0–15.0)
MCH: 27 pg (ref 26.0–34.0)
MCHC: 33.8 g/dL (ref 30.0–36.0)
MCV: 79.8 fL (ref 78.0–100.0)
PLATELETS: 165 10*3/uL (ref 150–400)
RBC: 4.41 MIL/uL (ref 3.87–5.11)
RDW: 13.7 % (ref 11.5–15.5)
WBC: 5.9 10*3/uL (ref 4.0–10.5)

## 2016-02-03 LAB — URINE MICROSCOPIC-ADD ON

## 2016-02-03 NOTE — MAU Provider Note (Signed)
History     CSN: [id number]  Arrival date and time: 02/03/16 2220   First Provider Initiated Contact with Patient 02/03/16 2256      Chief Complaint  Patient presents with  . Back Pain  . Vaginal Bleeding   HPI  Cindy Macias is a 30 y.o. [phone] at 735w2d by LMP who presents with back pain & vaginal bleeding. [facility] bleeding in pregnancy. Ultrasound showed irregularly shaped IUGS with fetal pole. Pt was to f/u with [facility] but never did d/t working everyday. States vaginal bleeding has continued since then but has never been more than pink spotting. Denies abdominal pain or fever.  Reports low right back pain x 2 hours PTA. Reports pain as constant & tight. Rates pain 5/10. Has not treated. Denies aggravating or alleviated factors.   OB History    Gravida Para Term Preterm AB TAB SAB Ectopic Multiple Living   6 3 3  2  2   3       History reviewed. No pertinent past medical history.  Past Surgical History  Procedure Laterality Date  . Cesarean section      History reviewed. No pertinent family history.  Social History  Substance Use Topics  . Smoking status: Current Every Day Smoker  . Smokeless tobacco: None  . Alcohol Use: No    Allergies: No Known Allergies  Prescriptions prior to admission  Medication Sig Dispense Refill Last Dose  . metoCLOPramide (REGLAN) 5 MG tablet Take 1 tablet (5 mg total) by mouth every 6 (six) hours as needed for nausea or vomiting. (Patient not taking: Reported on 07/19/2014) 10 tablet 0 Not Taking at Unknown time  . metroNIDAZOLE (FLAGYL) 250 MG tablet Take 1 tablet (250 mg total) by mouth 3 (three) times daily. 21 tablet 0   . Prenatal Vit-Fe Fumarate-FA (PRENATAL COMPLETE) 14-0.4 MG TABS Take 1 tablet by mouth daily. (Patient not taking: Reported on 07/19/2014) 20 each 0 Not Taking at Unknown time    Review of Systems  Constitutional: Negative.   Gastrointestinal: Negative.   Genitourinary: Negative for  dysuria, frequency, hematuria and flank pain.       + vaginal bleeding  Musculoskeletal: Positive for back pain. Negative for falls and neck pain.   Physical Exam   Blood pressure 114/68, pulse 80, temperature 98.3 F (36.8 C), temperature source Oral, resp. rate 18, last menstrual period 12/20/2015.  Physical Exam  Nursing note and vitals reviewed. Constitutional: She is oriented to person, place, and time. She appears well-developed and well-nourished. No distress.  HENT:  Head: Normocephalic and atraumatic.  Eyes: Conjunctivae are normal. Right eye exhibits no discharge. Left eye exhibits no discharge. No scleral icterus.  Neck: Normal range of motion.  Cardiovascular: Normal rate.   Respiratory: Effort normal. No respiratory distress.  GI: Soft. There is no tenderness. There is no CVA tenderness.  Genitourinary: Cervix exhibits no motion tenderness, no discharge and no friability. No bleeding in the vagina. Vaginal discharge (small amount of thin tan discharge) found.  Cervix closed  Musculoskeletal:       Lumbar back: She exhibits tenderness, pain and spasm. She exhibits no bony tenderness, no swelling and no edema.  Neurological: She is alert and oriented to person, place, and time.  Skin: Skin is warm and dry. She is not diaphoretic.  Psychiatric: She has a normal mood and affect. Her behavior is normal. Judgment and thought content normal.    MAU Course  Procedures Results  for orders placed or performed during the hospital encounter of 02/03/16 (from the past 24 hour(s))  Urinalysis, Routine w reflex microscopic (not [facility])     Status: Abnormal   Collection Time: 02/03/16 10:28 PM  Result Value Ref Range   Color, Urine YELLOW YELLOW   APPearance CLEAR CLEAR   Specific Gravity, Urine 1.020 1.005 - 1.030   pH 6.0 5.0 - 8.0   Glucose, UA NEGATIVE NEGATIVE mg/dL   Hgb urine dipstick SMALL (A) NEGATIVE   Bilirubin Urine NEGATIVE NEGATIVE   Ketones, ur NEGATIVE NEGATIVE  mg/dL   Protein, ur NEGATIVE NEGATIVE mg/dL   Nitrite NEGATIVE NEGATIVE   Leukocytes, UA NEGATIVE NEGATIVE  Urine microscopic-add on     Status: Abnormal   Collection Time: 02/03/16 10:28 PM  Result Value Ref Range   Squamous Epithelial / LPF 0-5 (A) NONE SEEN   WBC, UA 0-5 0 - 5 WBC/hpf   RBC / HPF 0-5 0 - 5 RBC/hpf   Bacteria, UA FEW (A) NONE SEEN  CBC     Status: Abnormal   Collection Time: 02/03/16 10:47 PM  Result Value Ref Range   WBC 5.9 4.0 - 10.5 K/uL   RBC 4.41 3.87 - 5.11 MIL/uL   Hemoglobin 11.9 (L) 12.0 - 15.0 g/dL   HCT 56.235.2 (L) 13.036.0 - 86.546.0 %   MCV 79.8 78.0 - 100.0 fL   MCH 27.0 26.0 - 34.0 pg   MCHC 33.8 30.0 - 36.0 g/dL   RDW 78.413.7 69.611.5 - 29.515.5 %   Platelets 165 150 - 400 K/uL    [place] Ob Transvaginal  02/03/2016  CLINICAL DATA:  Pregnant patient in first-trimester pregnancy with vaginal bleeding and abdominal/ low back pain for 2 hours. EXAM: TRANSVAGINAL OB ULTRASOUND TECHNIQUE: Transvaginal ultrasound was performed for complete evaluation of the gestation as well as the maternal uterus, adnexal regions, and pelvic cul-de-sac. COMPARISON:  Obstetric ultrasound 01/14/2016 FINDINGS: Intrauterine gestational sac: Present, irregular in shape. Yolk sac:  Not visualized. Embryo: Not visualized, previously was present with possible cardiac activity. MSD:  3.0 cm [redacted]W[redacted]D Subchorionic hemorrhage: Previous subchorionic hemorrhage not as well visualized. Maternal uterus/adnexae: The right ovary is normal. The left ovary is not visualized. No pelvic free fluid. Examination limited secondary to patient discomfort. IMPRESSION: Irregular intrauterine gestational sac, however the previous fetal pole is no longer seen. Findings meet criteria for failed pregnancy. Electronically Signed   By: Rubye OaksMelanie  Ehinger M.D.   On: 02/03/2016 23:52    MDM O positive Warm compress to back CBC, urinalysis, ultrasound Ultrasound shows irregularly shaped IUGS, fetal pole no longer seen, definitive for failed  pregnancy Discussed results with patient -- discussed cytotec vs expectant management. Patient prefers cytotec. Will send home with Rx as patient will be waiting for a cab to take her home. Pt states does have car but didn't drive self tonight d/t back pain. See cytotec checklist below  Assessment and Plan  A: 1. Missed abortion   2. Vaginal bleeding in pregnancy, first trimester   3. Abdominal pain affecting pregnancy   4. Muscle spasm of back     P: Discharge home Rx cytotec, percocet, flexeril Msg sent to clinic for f/u appt in 2 weeks Discussed placement of cytotec & reasons to return to MAU  Judeth HornErin Magan Winnett 02/03/2016, 10:48 PM       Early Intrauterine Pregnancy Failure  _X__  Documented intrauterine pregnancy failure less than or equal to [redacted] weeks gestation  _X__  No serious current illness  X___  Baseline Hgb greater than or equal to 10g/dl  _X__  Patient has easily accessible transportation to the hospital  _X__  Clear preference  _X_  Practitioner/physician deems patient reliable  _X_  Counseling by practitioner or physician  _X_  Patient education by RN   n/a___  Rho-Gam given by RN if indicated  ___ Medication dispensed   ___   Cytotec 800 mcg  X__   Intravaginally by patient at home         __   Intravaginally by RN in MAU        __   Rectally by patient at home        __   Rectally by RN in MAU  ___  Ibuprofen 600 mg 1 tablet by mouth every 6 hours as needed #30  X___  Hydrocodone/acetaminophen 5/325 mg by mouth every 4 to 6 hours as needed  ___  Phenergan 12.5 mg by mouth every 4 hours as needed for nausea

## 2016-02-03 NOTE — MAU Note (Signed)
Pt presents complaining of severe lower back pain that started 2 hours ago when she sat down at work and hasn't gone away. Denies bleeding or abnormal discharge. Has not tried any medication for the pain.

## 2016-02-04 DIAGNOSIS — O021 Missed abortion: Secondary | ICD-10-CM

## 2016-02-04 MED ORDER — CYCLOBENZAPRINE HCL 10 MG PO TABS
10.0000 mg | ORAL_TABLET | Freq: Once | ORAL | Status: AC
  Administered 2016-02-04: 10 mg via ORAL
  Filled 2016-02-04: qty 1

## 2016-02-04 MED ORDER — CYCLOBENZAPRINE HCL 5 MG PO TABS: 5.0000 mg | ORAL_TABLET | Freq: Three times a day (TID) | ORAL | Status: DC | PRN

## 2016-02-04 MED ORDER — MISOPROSTOL 200 MCG PO TABS: 800.0000 ug | ORAL_TABLET | Freq: Once | ORAL | Status: DC

## 2016-02-04 MED ORDER — OXYCODONE-ACETAMINOPHEN 5-325 MG PO TABS: 2.0000 | ORAL_TABLET | Freq: Four times a day (QID) | ORAL | Status: DC | PRN

## 2016-02-04 NOTE — Discharge Instructions (Signed)
FACTS YOU SHOULD KNOW  WHAT IS AN EARLY PREGNANCY FAILURE? Once the egg is fertilized with the sperm and begins to develop, it attaches to the lining of the uterus. This early pregnancy tissue may not develop into an embryo (the beginning stage of a baby). Sometimes an embryo does develop but does not continue to grow. These problems can be seen on ultrasound.   MANAGEMNT OF EARLY PREGNANCY FAILURE: About 4 out of 100 (0.25%) women will have a pregnancy loss in her lifetime.  One in five pregnancies is found to be an early pregnancy failure.  There are 3 ways to care for an early pregnancy failure:   (1) Surgery, (2) Medicine, (3) Waiting for you to pass the pregnancy on your own. The decision as to how to proceed after being diagnosed with and early pregnancy failure is an individual one.  The decision can be made only after appropriate counseling.  You need to weigh the pros and cons of the 3 choices. Then you can make the choice that works for you. SURGERY (D&E) . Procedure over in 1 day . Requires being put to sleep . Bleeding may be light . Possible problems during surgery, including injury to womb(uterus) . Care provider has more control Medicine (CYTOTEC) . The complete procedure may take days to weeks . No Surgery . Bleeding may be heavy at times . There may be drug side effects . Patient has more control Waiting . You may choose to wait, in which case your own body may complete the passing of the abnormal early pregnancy on its own in about 2-4 weeks . Your bleeding may be heavy at times . There is a small possibility that you may need surgery if the bleeding is too much or not all of the pregnancy has passed. CYTOTEC MANAGEMENT Prostaglandins (cytotec) are the most widely used drug for this purpose. They cause the uterus to cramp and contract. You will place the medicine yourself inside your vagina in the privacy of your home. Empting of the uterus should occur within 3 days but  the process may continue for several weeks. The bleeding may seem heavy at times. POSSIBLE SIDE EFFECTS FROM CYTOTEC . Nausea   Vomiting . Diarrhea Fever . Chills  Hot Flashes Side effects  from the process of the early pregnancy failure include: . Cramping  Bleeding . Headaches  Dizziness RISKS: This is a low risk procedure. Less than 1 in 100 women has a complication. An incomplete passage of the early pregnancy may occur. Also, Hemorrhage (heavy bleeding) could happen.  Rarely the pregnancy will not be passed completely. Excessively heavy bleeding may occur.  Your doctor may need to perform surgery to empty the uterus (D&E). Afterwards: Everybody will feel differently after the early pregnancy completion. You may have soreness or cramps for a day or two. You may have soreness or cramps for day or two.  You may have light bleeding for up to 2 weeks. You may be as active as you feel like being. If you have any of the following problems you may call Maternity Admissions Unit at [phone]. . If you have pain that does not get better  with pain medication . Bleeding that soaks through 2 thick full-sized sanitary pads in an hour . Cramps that last longer than 2 days . Foul smelling discharge . Fever above 100.4 degrees F Even if you do not have any of these symptoms, you should have a follow-up exam to make sure you   are healing properly. This appointment will be made for you before you leave the hospital. Your next normal period will start again in 4-6 week after the loss. You can get pregnant soon after the loss, so use birth control right away. Finally: Make sure all your questions are answered before during and after any procedure. Follow up with medical care and family planning methods.     Muscle Cramps and Spasms Muscle cramps and spasms occur when a muscle or muscles tighten and you have no control over this tightening (involuntary muscle contraction). They are a common problem and  can develop in any muscle. The most common place is in the calf muscles of the leg. Both muscle cramps and muscle spasms are involuntary muscle contractions, but they also have differences:   Muscle cramps are sporadic and painful. They may last a few seconds to a quarter of an hour. Muscle cramps are often more forceful and last longer than muscle spasms.  Muscle spasms may or may not be painful. They may also last just a few seconds or much longer. CAUSES  It is uncommon for cramps or spasms to be due to a serious underlying problem. In many cases, the cause of cramps or spasms is unknown. Some common causes are:   Overexertion.   Overuse from repetitive motions (doing the same thing over and over).   Remaining in a certain position for a long period of time.   Improper preparation, form, or technique while performing a sport or activity.   Dehydration.   Injury.   Side effects of some medicines.   Abnormally low levels of the salts and ions in your blood (electrolytes), especially potassium and calcium. This could happen if you are taking water pills (diuretics) or you are pregnant.  Some underlying medical problems can make it more likely to develop cramps or spasms. These include, but are not limited to:   Diabetes.   Parkinson disease.   Hormone disorders, such as thyroid problems.   Alcohol abuse.   Diseases specific to muscles, joints, and bones.   Blood vessel disease where not enough blood is getting to the muscles.  HOME CARE INSTRUCTIONS   Stay well hydrated. Drink enough water and fluids to keep your urine clear or pale yellow.  It may be helpful to massage, stretch, and relax the affected muscle.  For tight or tense muscles, use a warm towel, heating pad, or hot shower water directed to the affected area.  If you are sore or have pain after a cramp or spasm, applying ice to the affected area may relieve discomfort.  Put ice in a plastic  bag.  Place a towel between your skin and the bag.  Leave the ice on for 15-20 minutes, 03-04 times a day.  Medicines used to treat a known cause of cramps or spasms may help reduce their frequency or severity. Only take over-the-counter or prescription medicines as directed by your caregiver. SEEK MEDICAL CARE IF:  Your cramps or spasms get more severe, more frequent, or do not improve over time.  MAKE SURE YOU:   Understand these instructions.  Will watch your condition.  Will get help right away if you are not doing well or get worse.   This information is not intended to replace advice given to you by your health care provider. Make sure you discuss any questions you have with your health care provider.   Document Released: 01/05/2002 Document Revised: 11/10/2012 Document Reviewed: 07/02/2012 Elsevier Interactive  Patient Education 2016 [employer].

## 2016-02-17 ENCOUNTER — Encounter: Payer: Medicaid Other | Admitting: Advanced Practice Midwife

## 2016-02-17 ENCOUNTER — Telehealth: Payer: Self-pay | Admitting: *Deleted

## 2016-02-17 NOTE — Telephone Encounter (Signed)
Cindy Macias missed a scheduled appt for follow up after miscarriage- cytotec. I called her and she said she thought she couldn't come because someone called her and she said she had to have her medicaid card- I informed her we could still see her, and gave her Monday am appt.

## 2016-02-20 ENCOUNTER — Encounter: Payer: Medicaid Other | Admitting: Obstetrics & Gynecology

## 2016-08-11 IMAGING — US US OB COMP LESS 14 WK
1 series · 13 of 28 positions shown · non-contrast
Comparison: None.

CLINICAL DATA: Pelvic pain and vaginal bleeding. Estimated
gestational age by LMP is 10 weeks 6 days. Quantitative beta HCG is
[DATE].

EXAM:
OBSTETRIC <14 WK US AND TRANSVAGINAL OB US
TECHNIQUE: Both transabdominal and transvaginal ultrasound examinations were
performed for complete evaluation of the gestation as well as the
maternal uterus, adnexal regions, and pelvic cul-de-sac.
Transvaginal technique was performed to assess early pregnancy.

[Series 1: us ob comp less 14 wk · 0.22mm/px · 13 of 126 slices shown]
[im 5/126]
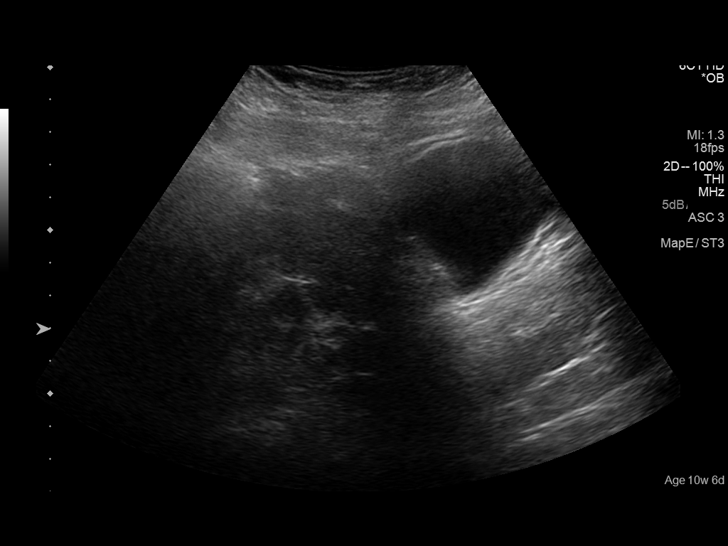
[im 14/126]
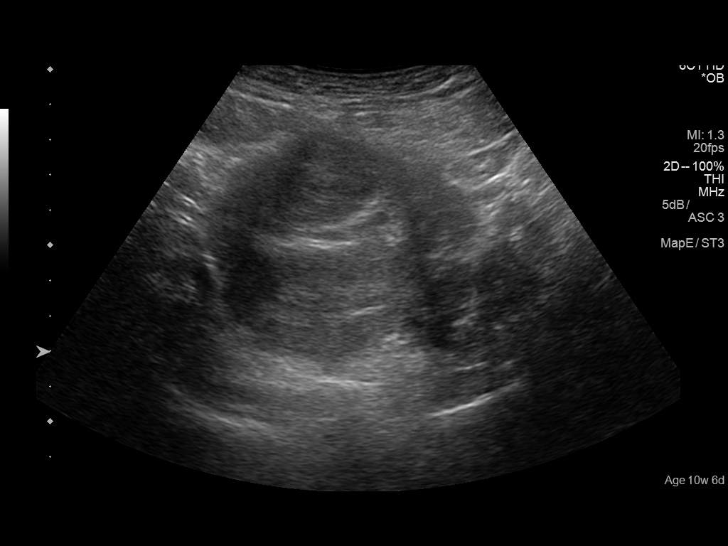
[im 24/126]
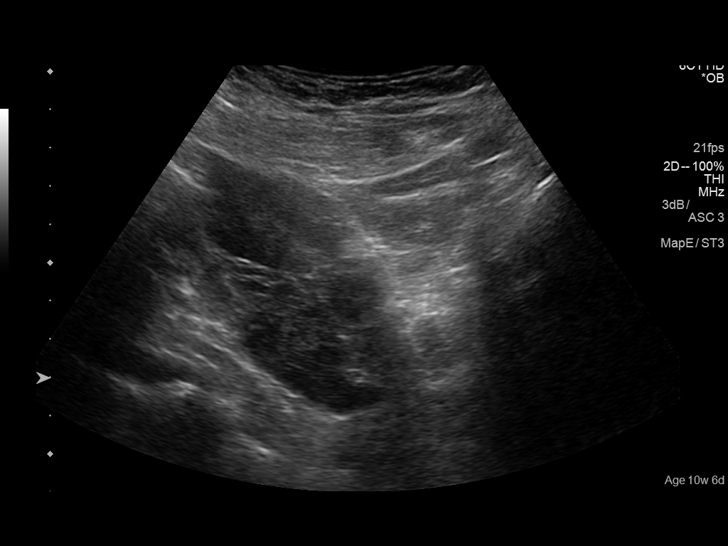
[im 33/126]
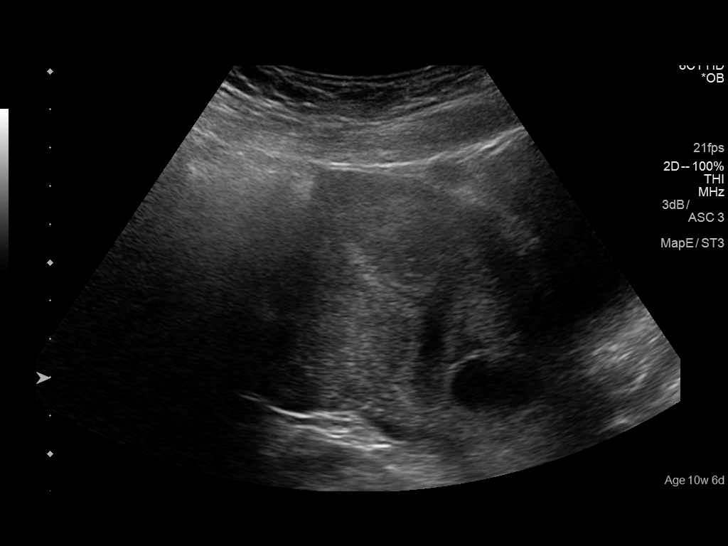
[im 42/126]
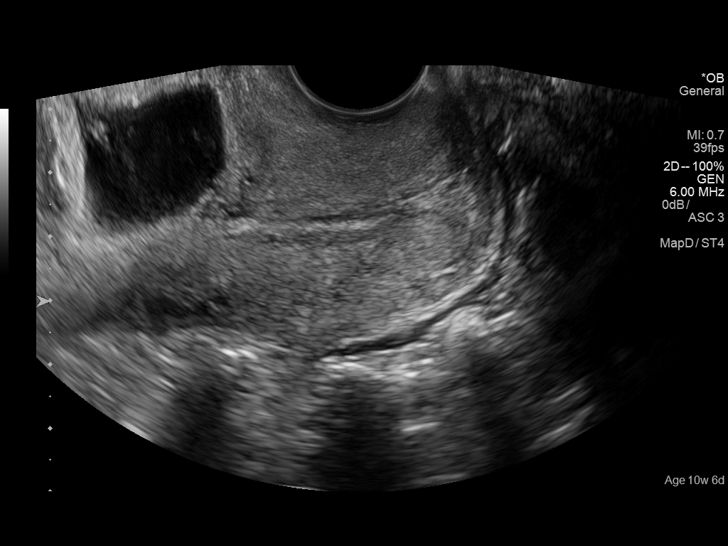
[im 51/126]
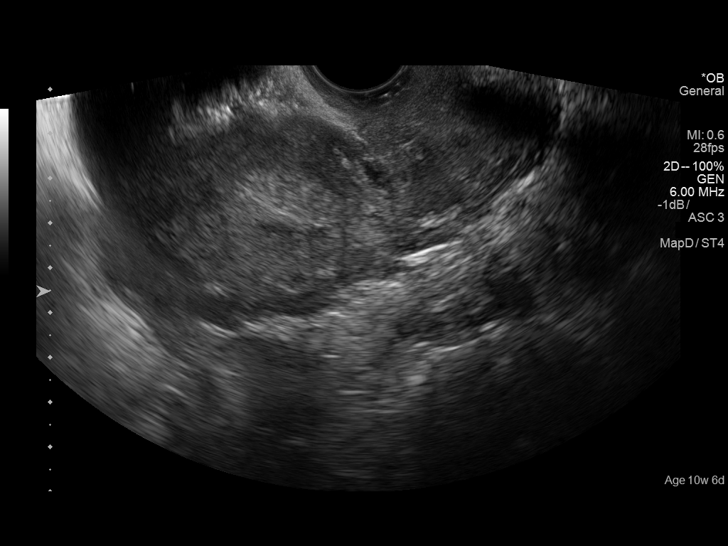
[im 65/126]
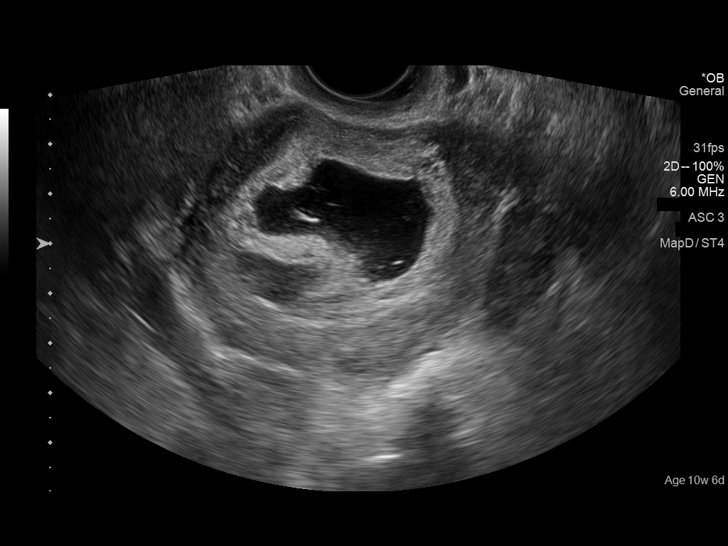
[im 75/126]
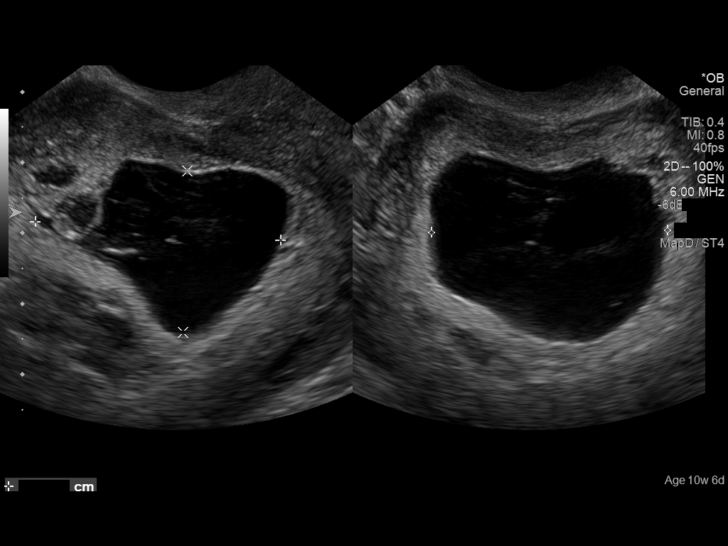
[im 84/126]
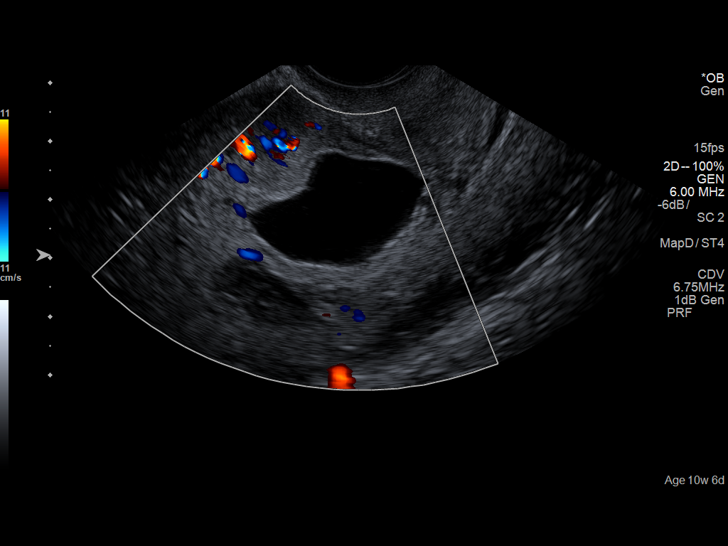
[im 93/126]
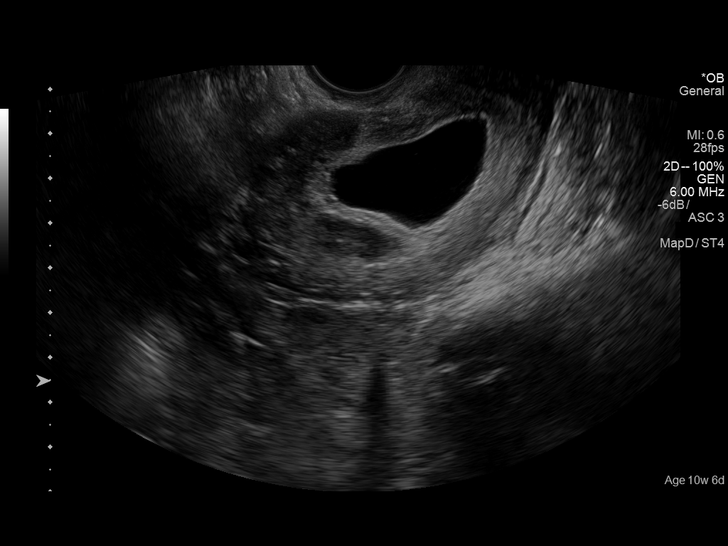
[im 102/126]
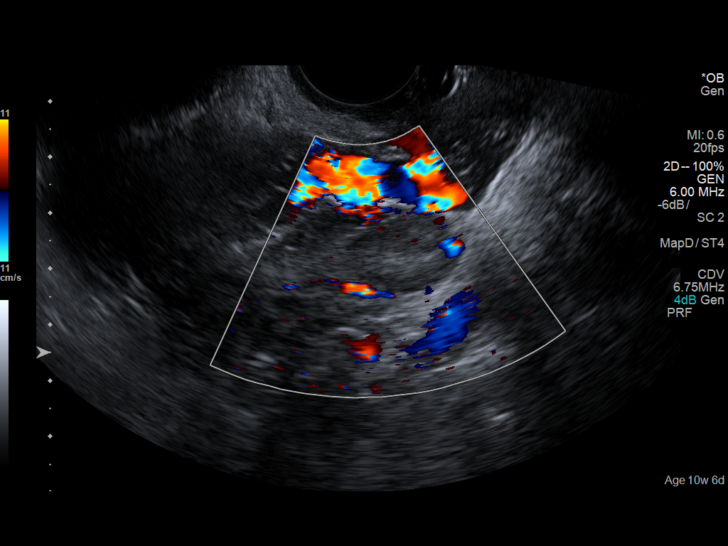
[im 112/126]
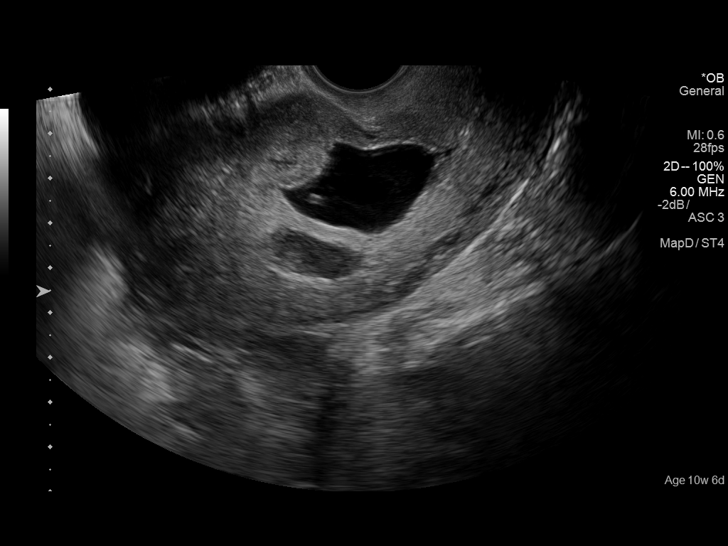
[im 121/126]
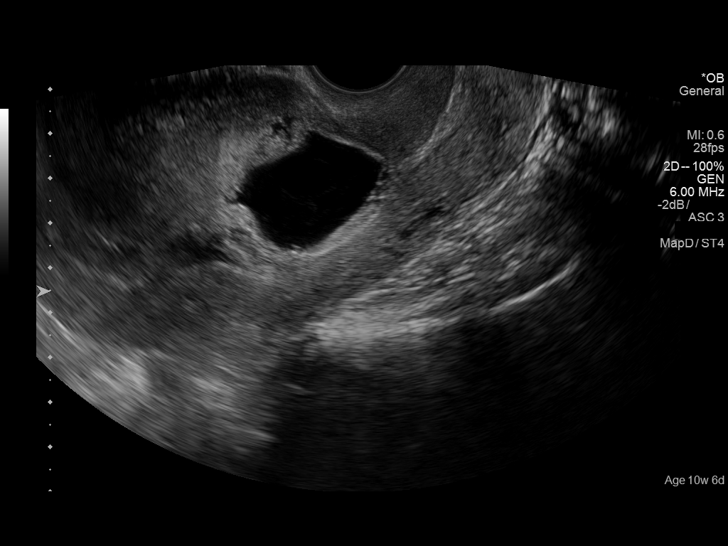

[13 of 28 positions shown; findings below may reference images not displayed]

FINDINGS: Intrauterine gestational sac: A single intrauterine gestational sac
is identified. The sac is somewhat irregular in shape and is
positioned somewhat inferiorly within the endometrium. Internal
echoes are present.

Yolk sac:  Yolk sac is not identified.

Embryo:  Possible tiny fetal pole identified.

Cardiac Activity: Possible cardiac activity visualized, appears slow
and unable to measure.

Heart Rate: Not able to measure.

CRL:  6  mm   6 w   2 d                  US EDC: 09/16/2016

Subchorionic hemorrhage: A small subchorionic hemorrhage is
demonstrated superiorly and posteriorly with respect to the
gestational sac and measures about 1.6 x 0.9 x 2.2 cm.

Maternal uterus/adnexae: Uterus is anteverted. No myometrial mass
lesions identified. Cervix is unremarkable. Uterus measures 11.4 x
5.9 x 7.4 cm.

Both ovaries are visualized and appear normal. Complex cyst in the
left ovary measuring about 1.8 cm diameter probably representing
corpus luteum. No free fluid in the pelvis.
IMPRESSION: Abnormal shaped intrauterine gestational sac is demonstrated within
the inferior uterine fundus. A fetal pole is identified with fetal
cardiac activity observed but unable to be measured. Yolk sac is not
visualized. Small subchorionic hemorrhage. Close clinical monitoring
is recommended in the patient may benefit from follow-up ultrasound
in 10-14 days for correlation.

## 2016-11-22 IMAGING — US US OB TRANSVAGINAL
1 series · 15 of 28 positions shown · non-contrast
Comparison: Obstetric ultrasound 01/14/2016

CLINICAL DATA: Pregnant patient in first-trimester pregnancy with
vaginal bleeding and abdominal/ low back pain for 2 hours.

EXAM:
TRANSVAGINAL OB ULTRASOUND
TECHNIQUE: Transvaginal ultrasound was performed for complete evaluation of the
gestation as well as the maternal uterus, adnexal regions, and
pelvic cul-de-sac.

[Series 1: us ob transvaginal · 15 of 29 slices shown]
[im 1/29]
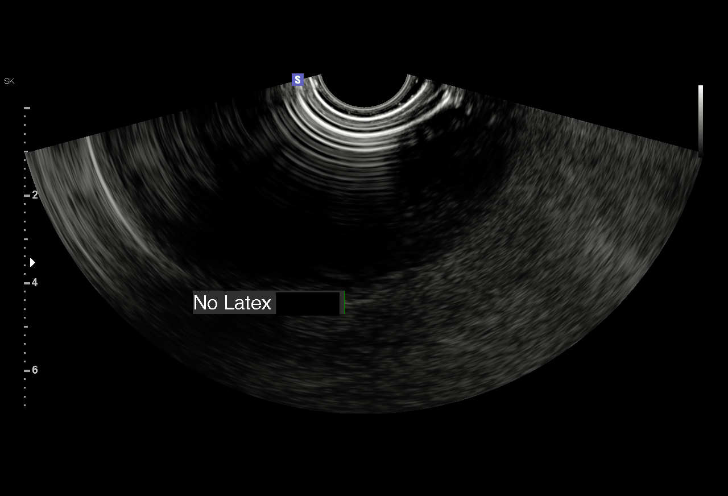
[im 3/29]
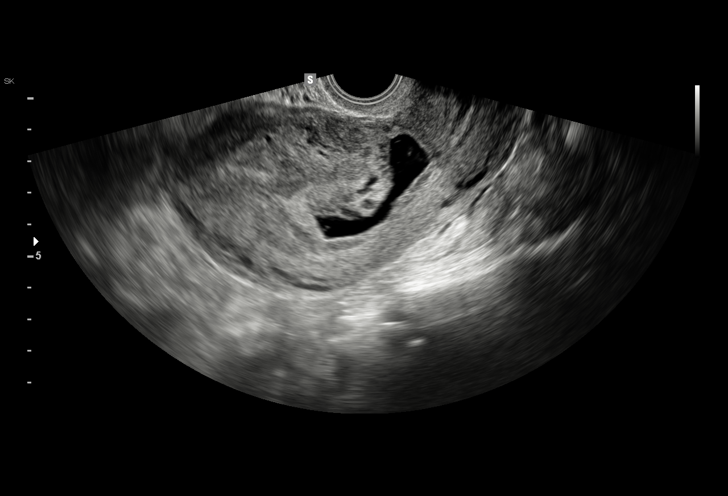
[im 5/29]
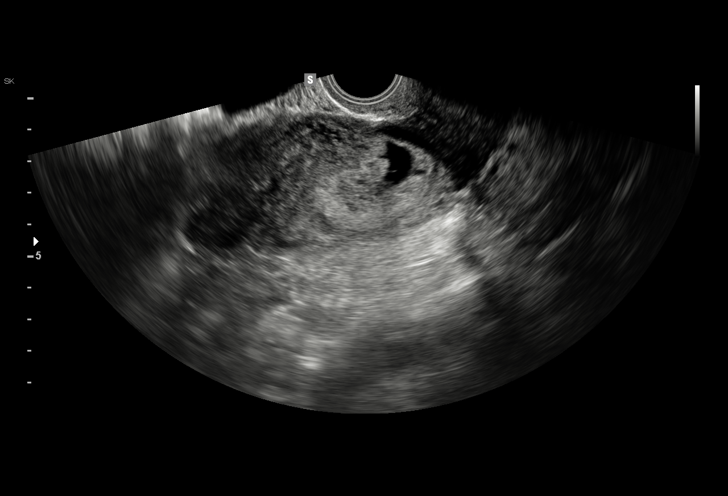
[im 7/29]
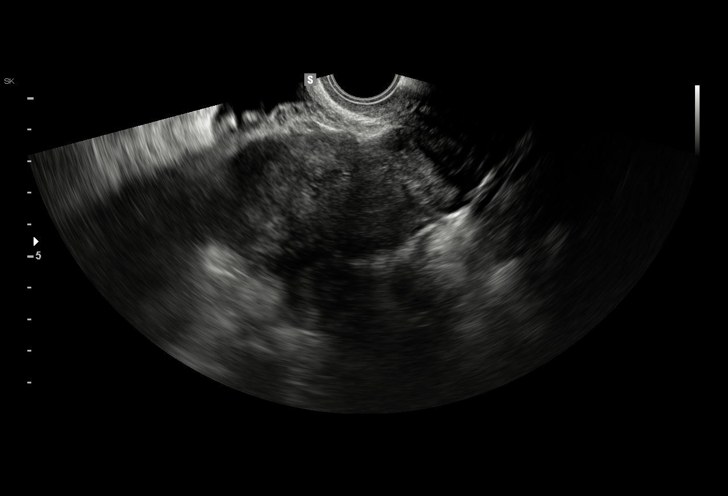
[im 9/29]
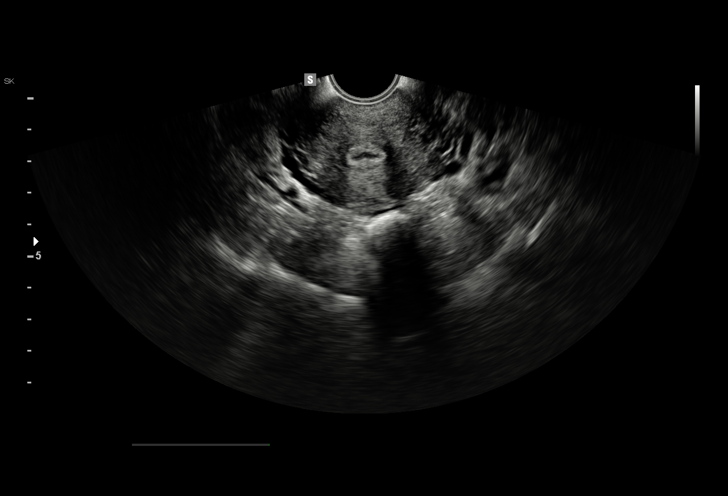
[im 11/29]
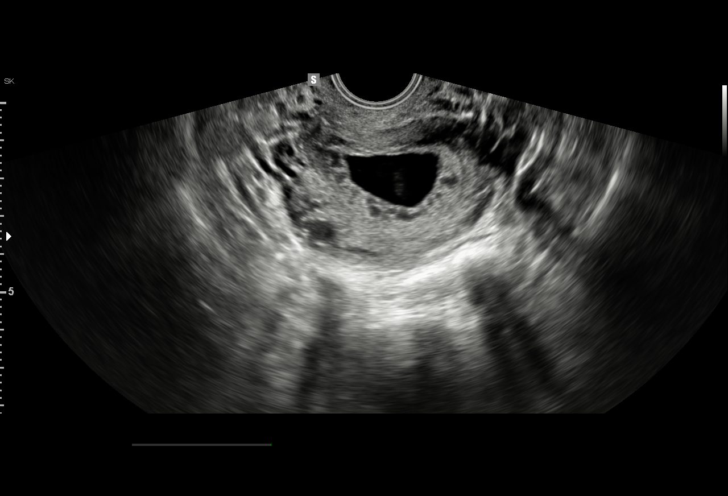
[im 13/29]
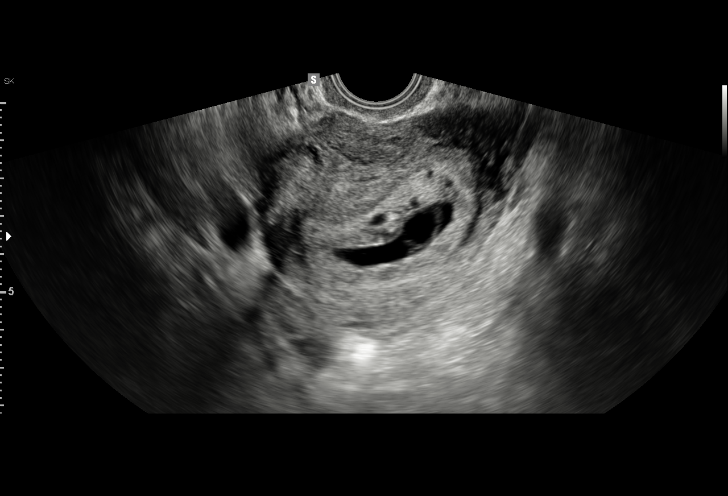
[im 15/29]
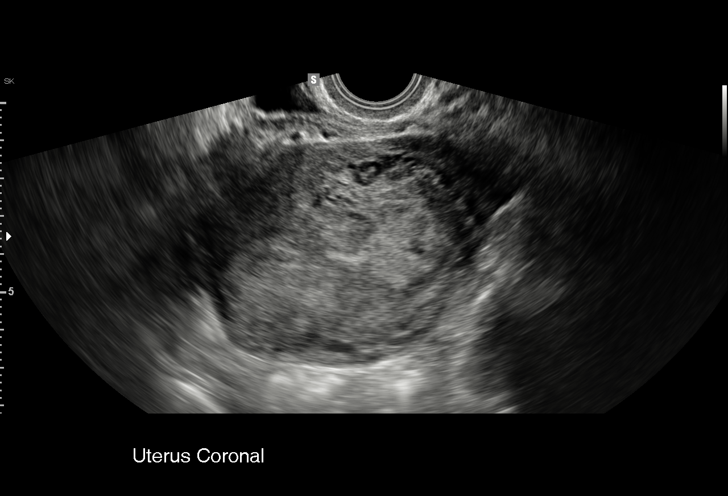
[im 16/29]
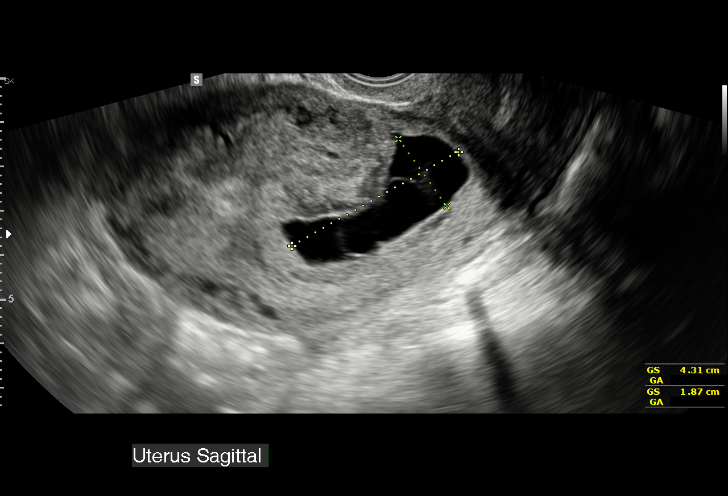
[im 18/29]
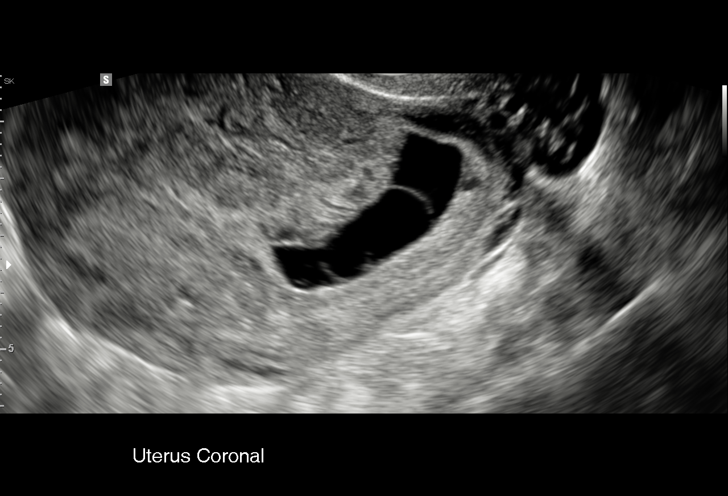
[im 20/29]
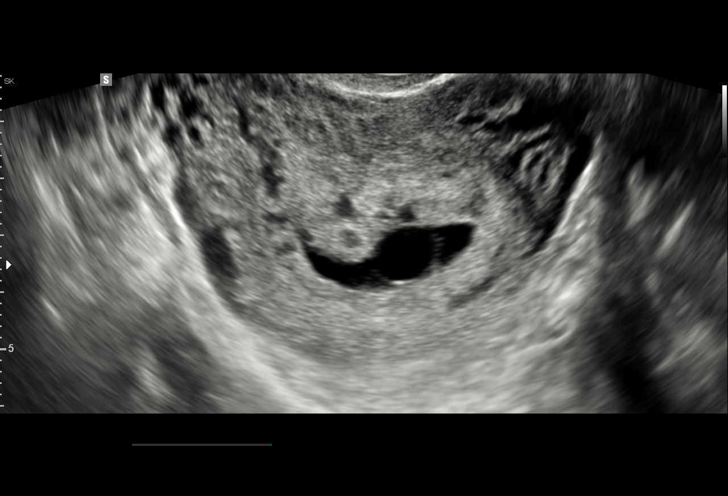
[im 22/29]
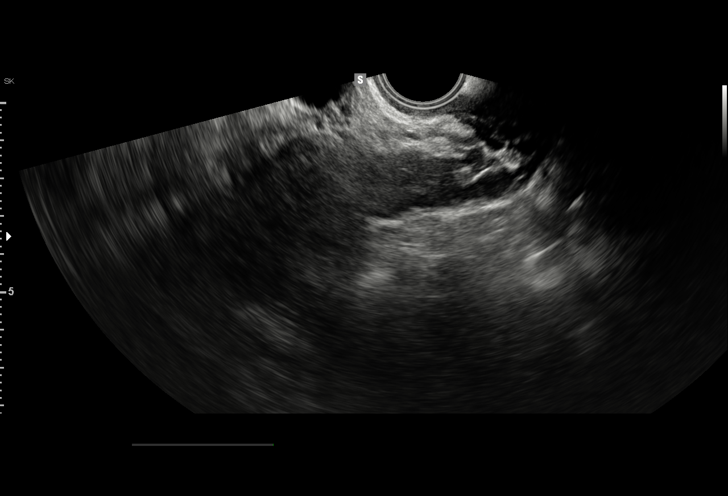
[im 24/29]
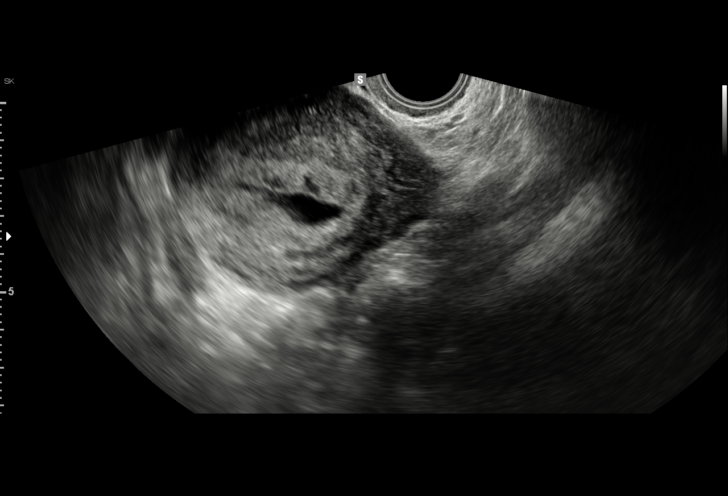
[im 26/29]
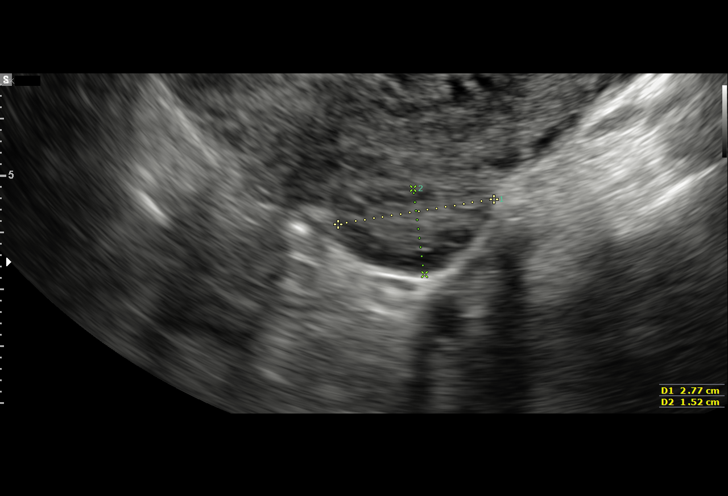
[im 29/29]
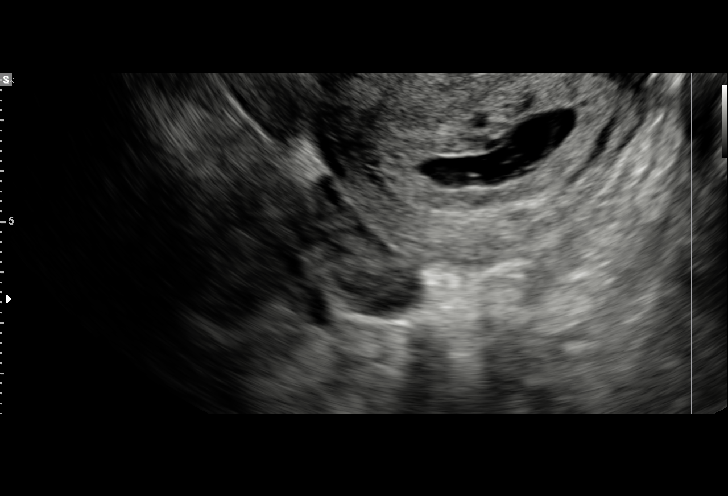

[15 of 28 positions shown; findings below may reference images not displayed]

FINDINGS: Intrauterine gestational sac: Present, irregular in shape.

Yolk sac:  Not visualized.

Embryo: Not visualized, previously was present with possible cardiac
activity.

MSD:  3.0 cm 8W0D

Subchorionic hemorrhage: Previous subchorionic hemorrhage not as
well visualized.

Maternal uterus/adnexae: The right ovary is normal. The left ovary
is not visualized. No pelvic free fluid.

Examination limited secondary to patient discomfort.
IMPRESSION: Irregular intrauterine gestational sac, however the previous fetal
pole is no longer seen. Findings meet criteria for failed pregnancy.

## 2016-12-08 ENCOUNTER — Encounter (HOSPITAL_COMMUNITY): Payer: Self-pay

## 2018-03-19 ENCOUNTER — Other Ambulatory Visit (HOSPITAL_COMMUNITY)
Admission: RE | Admit: 2018-03-19 | Discharge: 2018-03-19 | Disposition: A | Discharge status: Home / Self Care | Payer: Medicaid Other | Source: Ambulatory Visit | Attending: Family Medicine | Admitting: Family Medicine

## 2018-03-19 ENCOUNTER — Other Ambulatory Visit: Payer: Self-pay

## 2018-03-19 ENCOUNTER — Ambulatory Visit: Payer: Medicaid Other | Admitting: Licensed Clinical Social Worker

## 2018-03-19 ENCOUNTER — Encounter: Payer: Self-pay | Admitting: Family Medicine

## 2018-03-19 ENCOUNTER — Ambulatory Visit: Payer: Medicaid Other | Attending: Family Medicine | Admitting: Family Medicine

## 2018-03-19 ENCOUNTER — Encounter: Payer: Self-pay | Admitting: General Practice

## 2018-03-19 VITALS — BP 129/84 | HR 73 | Temp 99.3°F | Resp 18 | Ht 60.0 in | Wt 165.8 lb

## 2018-03-19 DIAGNOSIS — N898 Other specified noninflammatory disorders of vagina: Secondary | ICD-10-CM

## 2018-03-19 DIAGNOSIS — Z202 Contact with and (suspected) exposure to infections with a predominantly sexual mode of transmission: Secondary | ICD-10-CM | POA: Diagnosis not present

## 2018-03-19 DIAGNOSIS — M546 Pain in thoracic spine: Secondary | ICD-10-CM | POA: Insufficient documentation

## 2018-03-19 DIAGNOSIS — Z7689 Persons encountering health services in other specified circumstances: Secondary | ICD-10-CM | POA: Diagnosis not present

## 2018-03-19 DIAGNOSIS — D649 Anemia, unspecified: Secondary | ICD-10-CM

## 2018-03-19 DIAGNOSIS — F17200 Nicotine dependence, unspecified, uncomplicated: Secondary | ICD-10-CM | POA: Insufficient documentation

## 2018-03-19 DIAGNOSIS — F419 Anxiety disorder, unspecified: Secondary | ICD-10-CM

## 2018-03-19 DIAGNOSIS — M549 Dorsalgia, unspecified: Secondary | ICD-10-CM

## 2018-03-19 DIAGNOSIS — R5383 Other fatigue: Secondary | ICD-10-CM | POA: Insufficient documentation

## 2018-03-19 DIAGNOSIS — F332 Major depressive disorder, recurrent severe without psychotic features: Secondary | ICD-10-CM

## 2018-03-19 DIAGNOSIS — F329 Major depressive disorder, single episode, unspecified: Secondary | ICD-10-CM | POA: Insufficient documentation

## 2018-03-19 NOTE — BH Specialist Note (Signed)
Integrated Behavioral Health Initial Visit  MRN: [id number] Name: Cindy Macias  Number of Integrated Behavioral Health Clinician visits:: 1/6 Session Start time: 10:20 AM  Session End time: 10:50 AM Total time: 30 minutes  Type of Service: Integrated Behavioral Health- Individual/Family Interpretor:No. Interpretor Name and Language: N/A   Warm Hand Off Completed.       SUBJECTIVE: Cindy Macias is a 32 y.o. female accompanied by self Patient was referred by Dr. Jillyn HiddenFulp for depression and anxiety. Patient reports the following symptoms/concerns: feelings of sadness and worry, difficulty sleeping, low energy, feeling bad about self, decreased concentration, restlessness, irritability, and hx of suicidal ideations Duration of problem: "All my life" Pt states that pt's mother passed away at 32 years old triggering depression and anxiety; Severity of problem: severe  OBJECTIVE: Mood: Anxious and Affect: Depressed and Tearful Risk of harm to self or others: No plan to harm self or others Pt scored positive on PHQ-9; however, denies current SI/HI/AVH. Protective factors were identified, safety plan was discussed, and crisis intervention resources were provided  LIFE CONTEXT: Family and Social: Pt reports having no support for herself and three minor children. She is currently homeless School/Work: Pt is unemployed due to no transportation. She receives medicaid Self-Care: Pt participates in a substance use treatment program ([employer] [facility]) that also assists with housing.  Life Changes: Pt and family are homeless and she receives no support. Has difficulty managing mental health due to increased stressors  GOALS ADDRESSED: Patient will: 1. Reduce symptoms of: anxiety, depression and stress 2. Increase knowledge and/or ability of: coping skills and healthy habits  3. Demonstrate ability to: Increase healthy adjustment to current life circumstances, Increase adequate support  systems for patient/family and Decrease self-medicating behaviors  INTERVENTIONS: Interventions utilized: Solution-Focused Strategies, Supportive Counseling, Psychoeducation and/or Health Education and Link to [employer]  Standardized Assessments completed: GAD-7 and PHQ 2&9 with C-SSRS  ASSESSMENT: Patient currently experiencing depression and anxiety triggered by psychosocial stressors. She reports feelings of sadness and worry, difficulty sleeping, low energy, feeling bad about self, decreased concentration, restlessness, irritability, and hx of suicidal ideations. Pt scored positive on PHQ-9; however, denies current SI/HI/AVH. Protective factors were identified, safety plan was discussed, and crisis intervention resources were provided.    Patient may benefit from psychoeducation, psychotherapy, and medication management. LCSWA educated pt on the correlation between one's physical and mental health, in addition, to how stress can negatively impact health. Healthy coping skills were discussed to manage stressors and decrease symptoms. Supportive resources for obtaining employment were provided.   PLAN: 1. Follow up with behavioral health clinician on : Pt was encouraged to contact LCSWA if symptoms worsen or fail to improve to [facility]. 2. Behavioral recommendations: LCSWA recommends that pt apply healthy coping skills discussed and utilize provided resources. Pt is encouraged to schedule follow up appointment with LCSWA 3. Referral(s): Integrated [facility] (In Clinic) and [employer] [facility]:  Finances 4. "From scale of 1-10, how likely are you to follow plan?":   Bridgett LarssonJasmine D Shatara Stanek, LCSW 03/21/18 4:00 PM

## 2018-03-19 NOTE — Progress Notes (Addendum)
Subjective:    Patient ID: Cindy Macias, female    DOB: 01/28/1986, 32 y.o.   MRN: [id number]  HPI  32 yo female new to the practice. Patient is here to establish care and has concerns regarding past anemia as well as recent onset within the past 2 weeks of vaginal discharge and possible exposure to a STD. Patient denies any abdominal or pelvic pain. She reports no concerns regarding possible pregnancy at this time. Patient reports last menses occurring from 02/28/18-03/04/18 and periods are regular. Patient states that the discharge for the most part has been clear.  Patient states that she has had intercourse within the past 4 weeks and is concerned that she may have been exposed to sexually transmitted disease.  Patient does have a history of contracting gonorrhea at age 32 but this was treated. No current fever or dysuria. Patient does have some fatigue and reports a past medical history of anemia. Patient also reports elevated blood pressure during pregnancy but recent blood pressures have been normal.      Patient has some fatigue. Patient is the single parent of 3 children. Patient reports that she is currently in a recovery program and was told that she needs to establish with a primary care doctor as well as an OB/GYN. Patient thinks that she also has some issues with depression and is being set up through her program for counseling.  Past Medical History:  Diagnosis Date  . Anemia    Family History  Problem Relation Age of Onset  . Heart disease Maternal Grandmother   . Diabetes Neg Hx   . Stroke Neg Hx   . Cancer Neg Hx    Social History   Tobacco Use  . Smoking status: Current Every Day Smoker  . Smokeless tobacco: Never Used  Substance Use Topics  . Alcohol use: Not Currently  . Drug use: Yes    Types: Marijuana   Past Surgical History:  Procedure Laterality Date  . CESAREAN SECTION     No Known Allergies   Review of Systems  Constitutional: Positive for fatigue.  Negative for chills and fever.  Respiratory: Negative for cough and shortness of breath.   Cardiovascular: Negative for chest pain and palpitations.  Gastrointestinal: Negative for abdominal pain.  Genitourinary: Negative for difficulty urinating, dysuria and frequency.  Musculoskeletal: Positive for back pain (patient has some recurrent back pain). Negative for joint swelling and myalgias.  Neurological: Negative for dizziness and headaches.  Psychiatric/Behavioral: Negative for suicidal ideas. The patient is nervous/anxious.        Objective:   Physical Exam BP 129/84 (BP Location: Left Arm, Patient Position: Sitting, Cuff Size: Normal)   Pulse 73   Temp 99.3 F (37.4 C) (Oral)   Resp 18   Ht 5' (1.524 m)   Wt 165 lb 12.8 oz (75.2 kg)   HC 2" (5.1 cm)   SpO2 100%   BMI 32.38 kg/m   Exam: Well-nourished, well-developed female in no acute distress ENT: TMs gray bilaterally, nares with mild edema of the nasal turbinates, normal oropharynx Neck: Supple, no lymphadenopathy, no thyromegaly  Lungs: Clear to auscultation bilaterally CV: Regular rate and rhythm ABD: Abdomen is soft and non-tender Back: Patient with some left CVA tenderness on examination GU: Normal external appearance to the vaginal area, patient with some mild clear discharge within the vaginal canal and vault, patient with normal appearance to the cervix, patient with light green, mucoid discharge at the cervical office.  No  adnexal tenderness, no cervical motion tenderness EXT: No peripheral edema Psychiatric: Patient exhibits normal mood and judgment    Assessment & Plan:  1. Anemia, unspecified type Patient with complaint of some fatigue and does have history of anemia per self-report.  Patient will have CBC in follow-up of anemia and complaint of fatigue. - CBC with Differential  2. Exposure to sexually transmitted disease (STD) Patient with complaint of an concerned due to exposure to sexually transmitted  disease.  Patient had pelvic exam and sample was taken to send off for testing for STDs.  Patient will also have HIV blood test to today's visit.  Patient education on safe sex was provided as part of her after visit summary (AVS). - Cervicovaginal ancillary only - HIV antibody (with reflex)  3. Vaginal discharge Patient with presence of vaginal discharge.  Sample was taken to be sent for testing for STIs/STDs and patient will be notified of the results and if further treatment is needed based on those results.  Patient will also have blood test for HIV.  Patient is also being referred to OB/GYN as she still needs her annual Pap smear.  Patient also may need counseling regarding family planning and additional counseling regarding safe sexual practices to reduce risk of sexually transmitted infections. - Cervicovaginal ancillary only - HIV antibody (with reflex) - Ambulatory referral to Obstetrics / Gynecology  4. Mid back pain on left side On examination, patient with left CVA tenderness.  Urinalysis was done at today's visit to look for possible urinary tract infection as the source of her back pain.  Patient did not self report back pain at today's visit but did have CVA tenderness on exam.  If patient does have continued back pain she may take Tylenol or Motrin but should follow-up if back pain is not relieved by these medications or back pain worsens. - POCT URINALYSIS DIP (CLINITEK)  -Patient offered influenza immunization which she declined  -Patient with abnormal depression screening- reports that she will be receiving counseling through her recovery program. Also had social worker meet with patient at the end of today's visit -see social work note.  An After Visit Summary was printed and given to the patient.  Return in about 6 months (around 09/19/2018), or if symptoms worsen or fail to improve, for anemia and 6 months; .

## 2018-03-19 NOTE — Patient Instructions (Signed)
Anemia Anemia is a condition in which you do not have enough red blood cells or hemoglobin. Hemoglobin is a substance in red blood cells that carries oxygen. When you do not have enough red blood cells or hemoglobin (are anemic), your body cannot get enough oxygen and your organs may not work properly. As a result, you may feel very tired or have other problems. What are the causes? Common causes of anemia include:  Excessive bleeding. Anemia can be caused by excessive bleeding inside or outside the body, including bleeding from the intestine or from periods in women.  Poor nutrition.  Long-lasting (chronic) kidney, thyroid, and liver disease.  Bone marrow disorders.  Cancer and treatments for cancer.  HIV (human immunodeficiency virus) and AIDS (acquired immunodeficiency syndrome).  Treatments for HIV and AIDS.  Spleen problems.  Blood disorders.  Infections, medicines, and autoimmune disorders that destroy red blood cells.  What are the signs or symptoms? Symptoms of this condition include:  Minor weakness.  Dizziness.  Headache.  Feeling heartbeats that are irregular or faster than normal (palpitations).  Shortness of breath, especially with exercise.  Paleness.  Cold sensitivity.  Indigestion.  Nausea.  Difficulty sleeping.  Difficulty concentrating.  Symptoms may occur suddenly or develop slowly. If your anemia is mild, you may not have symptoms. How is this diagnosed? This condition is diagnosed based on:  Blood tests.  Your medical history.  A physical exam.  Bone marrow biopsy.  Your health care provider may also check your stool (feces) for blood and may do additional testing to look for the cause of your bleeding. You may also have other tests, including:  Imaging tests, such as a CT scan or MRI.  Endoscopy.  Colonoscopy.  How is this treated? Treatment for this condition depends on the cause. If you continue to lose a lot of blood,  you may need to be treated at a hospital. Treatment may include:  Taking supplements of iron, vitamin B12, or folic acid.  Taking a hormone medicine (erythropoietin) that can help to stimulate red blood cell growth.  Having a blood transfusion. This may be needed if you lose a lot of blood.  Making changes to your diet.  Having surgery to remove your spleen.  Follow these instructions at home:  Take over-the-counter and prescription medicines only as told by your health care provider.  Take supplements only as told by your health care provider.  Follow any diet instructions that you were given.  Keep all follow-up visits as told by your health care provider. This is important. Contact a health care provider if:  You develop new bleeding anywhere in the body. Get help right away if:  You are very weak.  You are short of breath.  You have pain in your abdomen or chest.  You are dizzy or feel faint.  You have trouble concentrating.  You have bloody or black, tarry stools.  You vomit repeatedly or you vomit up blood. Summary  Anemia is a condition in which you do not have enough red blood cells or enough of a substance in your red blood cells that carries oxygen (hemoglobin).  Symptoms may occur suddenly or develop slowly.  If your anemia is mild, you may not have symptoms.  This condition is diagnosed with blood tests as well as a medical history and physical exam. Other tests may be needed.  Treatment for this condition depends on the cause of the anemia. This information is not intended to replace advice   given to you by your health care provider. Make sure you discuss any questions you have with your health care provider. Document Released: 08/23/2004 Document Revised: 08/17/2016 Document Reviewed: 08/17/2016 Elsevier Interactive Patient Education  2018 [place] [facility] Practicing safe sex means taking steps before and during sex to reduce your risk  of:  Getting an STD (sexually transmitted disease).  Giving your partner an STD.  Unwanted pregnancy.  How can I practice safe sex?  To practice safe sex:  Limit your sexual partners to only one partner who is having sex with only you.  Avoid using alcohol and recreational drugs before having sex. These substances can affect your judgment.  Before having sex with a new partner: ? Talk to your partner about past partners, past STDs, and drug use. ? You and your partner should be screened for STDs and discuss the results with each other.  Check your body regularly for sores, blisters, rashes, or unusual discharge. If you notice any of these problems, visit your health care provider.  If you have symptoms of an infection or you are being treated for an STD, avoid sexual contact.  While having sex, use a condom. Make sure to: ? Use a condom every time you have vaginal, oral, or anal sex. Both females and males should wear condoms during oral sex. ? Keep condoms in place from the beginning to the end of sexual activity. ? Use a latex condom, if possible. Latex condoms offer the best protection. ? Use only water-based lubricants or oils to lubricate a condom. Using petroleum-based lubricants or oils will weaken the condom and increase the chance that it will break.  See your health care provider for regular screenings, exams, and tests for STDs.  Talk with your health care provider about the form of birth control (contraception) that is best for you.  Get vaccinated against hepatitis B and human papillomavirus (HPV).  If you are at risk of being infected with HIV (human immunodeficiency virus), talk with your health care provider about taking a prescription medicine to prevent HIV infection. You are considered at risk for HIV if: ? You are a man who has sex with other men. ? You are a heterosexual man or woman who is sexually active with more than one partner. ? You take drugs by  injection. ? You are sexually active with a partner who has HIV.  This information is not intended to replace advice given to you by your health care provider. Make sure you discuss any questions you have with your health care provider. Document Released: 08/23/2004 Document Revised: 11/30/2015 Document Reviewed: 06/05/2015 Elsevier Interactive Patient Education  [employer].

## 2018-03-19 NOTE — Progress Notes (Signed)
Patient stated today she hoped to get a well visit done as well as est care with pcp.

## 2018-03-20 ENCOUNTER — Other Ambulatory Visit: Payer: Self-pay | Admitting: Family Medicine

## 2018-03-20 DIAGNOSIS — D509 Iron deficiency anemia, unspecified: Secondary | ICD-10-CM

## 2018-03-20 DIAGNOSIS — B373 Candidiasis of vulva and vagina: Secondary | ICD-10-CM

## 2018-03-20 DIAGNOSIS — B3731 Acute candidiasis of vulva and vagina: Secondary | ICD-10-CM

## 2018-03-20 DIAGNOSIS — A599 Trichomoniasis, unspecified: Secondary | ICD-10-CM

## 2018-03-20 DIAGNOSIS — N760 Acute vaginitis: Secondary | ICD-10-CM

## 2018-03-20 DIAGNOSIS — B9689 Other specified bacterial agents as the cause of diseases classified elsewhere: Secondary | ICD-10-CM

## 2018-03-20 LAB — CBC WITH DIFFERENTIAL/PLATELET
Basophils Absolute: 0.1 x10E3/uL (ref 0.0–0.2)
Basos: 1 %
EOS (ABSOLUTE): 0 x10E3/uL (ref 0.0–0.4)
Eos: 1 %
Hematocrit: 30.4 % — ABNORMAL LOW (ref 34.0–46.6)
Hemoglobin: 8.7 g/dL — ABNORMAL LOW (ref 11.1–15.9)
Immature Grans (Abs): 0 x10E3/uL (ref 0.0–0.1)
Immature Granulocytes: 0 %
Lymphocytes Absolute: 1.2 x10E3/uL (ref 0.7–3.1)
Lymphs: 35 %
MCH: 19.9 pg — ABNORMAL LOW (ref 26.6–33.0)
MCHC: 28.6 g/dL — ABNORMAL LOW (ref 31.5–35.7)
MCV: 70 fL — ABNORMAL LOW (ref 79–97)
Monocytes Absolute: 0.3 x10E3/uL (ref 0.1–0.9)
Monocytes: 8 %
Neutrophils Absolute: 1.9 x10E3/uL (ref 1.4–7.0)
Neutrophils: 55 %
Platelets: 340 x10E3/uL (ref 150–450)
RBC: 4.37 x10E6/uL (ref 3.77–5.28)
RDW: 19.5 % — ABNORMAL HIGH (ref 12.3–15.4)
WBC: 3.5 x10E3/uL (ref 3.4–10.8)

## 2018-03-20 LAB — CERVICOVAGINAL ANCILLARY ONLY
Bacterial vaginitis: POSITIVE — AB
Candida vaginitis: POSITIVE — AB
Chlamydia: NEGATIVE
HPV: NOT DETECTED
Neisseria Gonorrhea: NEGATIVE
Trichomonas: POSITIVE — AB

## 2018-03-20 LAB — HIV ANTIBODY (ROUTINE TESTING W REFLEX): HIV Screen 4th Generation wRfx: NONREACTIVE

## 2018-03-20 MED ORDER — METRONIDAZOLE 500 MG PO TABS: 500.0000 mg | ORAL_TABLET | Freq: Three times a day (TID) | ORAL | 0 refills | Status: DC

## 2018-03-20 MED ORDER — FLUCONAZOLE 150 MG PO TABS: 150.0000 mg | ORAL_TABLET | Freq: Once | ORAL | 0 refills | Status: AC

## 2018-03-20 MED ORDER — FERROUS SULFATE 325 (65 FE) MG PO TABS: 325.0000 mg | ORAL_TABLET | Freq: Two times a day (BID) | ORAL | 3 refills | Status: DC

## 2018-03-20 NOTE — Progress Notes (Signed)
Patient with labs showing anemia with hemoglobin of 8.7 and MCV of 70.  Prescription was sent in for patient to take ferrous sulfate twice daily #60 with 3 refills.  Patient also had evidence of trichomonas, bacterial vaginitis and yeast on sample taken from the vaginal and cervical area.  Prescription was sent in for metronidazole 500 mg twice daily x7 days and Diflucan 150 mg p.o. x1.  Patient will be notified of her results.

## 2018-03-21 ENCOUNTER — Telehealth: Payer: Self-pay

## 2018-03-21 NOTE — Telephone Encounter (Signed)
Also Please also notify patient that her HIV test was negative.

## 2018-03-21 NOTE — Telephone Encounter (Signed)
-----   Message from Cain Saupeammie Fulp, MD sent at 03/20/2018  8:27 PM EDT ----- Please also notify patient that her HIV test was negative.

## 2018-03-21 NOTE — Telephone Encounter (Signed)
Patient was called to be informed of her most recent lab results. Patient did not answer. If the patient calls back please inform the patient that she does have trichomonas, bacterial vaginitis and presence of yeast. A prescription will be sent to the pharmacy for an antibiotic called metronidazole. Please do not drink any alcohol while on the metronidazole as the combination can cause you to have nausea and vomiting. A medication called Diflucan will be sent in for the yeast. You were also very anemic with a hemoglobin of 8.7, normal 12 or higher. A prescription will be sent in for you to take an iron supplement. Please follow-up in about 3 months regarding your anemia.

## 2018-03-24 LAB — CERVICOVAGINAL ANCILLARY ONLY: Herpes: NEGATIVE

## 2018-04-17 ENCOUNTER — Encounter: Payer: Medicaid Other | Admitting: Obstetrics and Gynecology

## 2018-04-17 NOTE — Telephone Encounter (Signed)
Pt called and would like the Nurse to give her the personal results, because she has some questions about them. Please follow up when possible

## 2018-04-21 ENCOUNTER — Encounter: Payer: Self-pay | Admitting: General Practice

## 2019-07-20 ENCOUNTER — Encounter (HOSPITAL_COMMUNITY): Payer: Self-pay | Admitting: Emergency Medicine

## 2019-07-20 ENCOUNTER — Other Ambulatory Visit: Payer: Self-pay

## 2019-07-20 ENCOUNTER — Emergency Department (HOSPITAL_COMMUNITY)
Admission: EM | Admit: 2019-07-20 | Discharge: 2019-07-20 | Disposition: A | Discharge status: Home / Self Care | Payer: Medicaid Other | Attending: Emergency Medicine | Admitting: Emergency Medicine

## 2019-07-20 DIAGNOSIS — O99331 Smoking (tobacco) complicating pregnancy, first trimester: Secondary | ICD-10-CM | POA: Diagnosis not present

## 2019-07-20 DIAGNOSIS — O26891 Other specified pregnancy related conditions, first trimester: Secondary | ICD-10-CM | POA: Insufficient documentation

## 2019-07-20 DIAGNOSIS — F17200 Nicotine dependence, unspecified, uncomplicated: Secondary | ICD-10-CM | POA: Diagnosis not present

## 2019-07-20 DIAGNOSIS — R1030 Lower abdominal pain, unspecified: Secondary | ICD-10-CM | POA: Diagnosis not present

## 2019-07-20 DIAGNOSIS — Z79899 Other long term (current) drug therapy: Secondary | ICD-10-CM | POA: Diagnosis not present

## 2019-07-20 DIAGNOSIS — Z3A12 12 weeks gestation of pregnancy: Secondary | ICD-10-CM | POA: Diagnosis not present

## 2019-07-20 DIAGNOSIS — Z3201 Encounter for pregnancy test, result positive: Secondary | ICD-10-CM

## 2019-07-20 DIAGNOSIS — M545 Low back pain: Secondary | ICD-10-CM | POA: Diagnosis not present

## 2019-07-20 LAB — URINALYSIS, ROUTINE W REFLEX MICROSCOPIC
Bilirubin Urine: NEGATIVE
Glucose, UA: NEGATIVE mg/dL
Hgb urine dipstick: NEGATIVE
Ketones, ur: NEGATIVE mg/dL
Leukocytes,Ua: NEGATIVE
Nitrite: NEGATIVE
Protein, ur: NEGATIVE mg/dL
Specific Gravity, Urine: 1.011 (ref 1.005–1.030)
pH: 8 (ref 5.0–8.0)

## 2019-07-20 LAB — PREGNANCY, URINE: Preg Test, Ur: POSITIVE — AB

## 2019-07-20 LAB — HCG, QUANTITATIVE, PREGNANCY: hCG, Beta Chain, Quant, S: 191975 m[IU]/mL — ABNORMAL HIGH (ref <5)

## 2019-07-20 NOTE — ED Provider Notes (Signed)
[facility] EMERGENCY DEPARTMENT Provider Note   CSN: [id number] Arrival date & time: 07/20/19  1236     History Chief Complaint  Patient presents with  . Back Pain    Cindy Macias is a 33 y.o. female.  33 y.o female with a PMH of Anemia presents to the ED with a chief complaint of lower back pain for the past week and a half.  Patient describes a cramping sensation originating in the lower lumbar spine with radiation into the abdomen.  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  She has not taken any medication for improvement in symptoms.  Her last menstrual period was May 31, 2019.  She does not have any nausea, vomiting, diarrhea, vaginal complaints, or urinary.  Denies any fever, prior history of cancer, bowel or bladder complaints.  The history is provided by the patient.       Past Medical History:  Diagnosis Date  . Anemia     There are no problems to display for this patient.   Past Surgical History:  Procedure Laterality Date  . CESAREAN SECTION       OB History    Gravida  6   Para  3   Term  3   Preterm      AB  2   Living  3     SAB  2   TAB      Ectopic      Multiple      Live Births  3           Family History  Problem Relation Age of Onset  . Heart disease Maternal Grandmother   . Diabetes Neg Hx   . Stroke Neg Hx   . Cancer Neg Hx     Social History   Tobacco Use  . Smoking status: Current Every Day Smoker  . Smokeless tobacco: Never Used  Substance Use Topics  . Alcohol use: Not Currently  . Drug use: Yes    Types: Marijuana    Home Medications Prior to Admission medications   Medication Sig Start Date End Date Taking? Authorizing Provider  cyclobenzaprine (FLEXERIL) 5 MG tablet Take 1 tablet (5 mg total) by mouth 3 (three) times daily as needed for  muscle spasms. Patient not taking: Reported on 03/19/2018 02/04/16   Judeth Horn, NP  ferrous sulfate 325 (65 FE) MG tablet Take 1 tablet (325 mg total) by mouth 2 (two) times daily with a meal. 03/20/18   Fulp, Cammie, MD  metroNIDAZOLE (FLAGYL) 500 MG tablet Take 1 tablet (500 mg total) by mouth 3 (three) times daily. 03/20/18   Fulp, Cammie, MD  misoprostol (CYTOTEC) 200 MCG tablet Place 4 tablets (800 mcg total) vaginally once. Patient not taking: Reported on 03/19/2018 02/04/16   Judeth Horn, NP  oxyCODONE-acetaminophen (PERCOCET/ROXICET) 5-325 MG tablet Take 2 tablets by mouth every 6 (six) hours as needed for severe pain. Patient not taking: Reported on 03/19/2018 02/04/16   Judeth Horn, NP    Allergies    Patient has no known allergies.  Review of Systems   Review of Systems  Constitutional: Negative for fever.  Genitourinary: Negative for difficulty urinating and flank pain.  Musculoskeletal: Positive for back pain.    Physical Exam Updated Vital Signs BP 117/69 (BP Location: Right Arm)   Pulse 73  Temp 98.2 F (36.8 C) (Oral)   Resp 18   LMP 05/31/2019   SpO2 100%   Physical Exam Vitals and nursing note reviewed.  Constitutional:      General: She is not in acute distress.    Appearance: She is well-developed.  HENT:     Head: Normocephalic and atraumatic.     Mouth/Throat:     Pharynx: No oropharyngeal exudate.  Eyes:     Pupils: Pupils are equal, round, and reactive to light.  Cardiovascular:     Rate and Rhythm: Regular rhythm.     Heart sounds: Normal heart sounds.  Pulmonary:     Effort: Pulmonary effort is normal. No respiratory distress.     Breath sounds: Normal breath sounds.  Abdominal:     General: Bowel sounds are normal. There is no distension.     Palpations: Abdomen is soft.     Tenderness: There is no abdominal tenderness.  Musculoskeletal:        General: No deformity.     Cervical back: Normal range of motion.     Lumbar back:  Tenderness present. No signs of trauma or bony tenderness. Normal range of motion. Negative right straight leg raise test and negative left straight leg raise test.     Right lower leg: No edema.     Left lower leg: No edema.     Comments: RLE- KF,KE 5/5 strength LLE- HF, HE 5/5 strength Normal gait. No pronator drift. No leg drop.  Patellar reflexes present and symmetric. CN I, II and VIII not tested. CN II-XII grossly intact bilaterally.     Skin:    General: Skin is warm and dry.  Neurological:     Mental Status: She is alert and oriented to person, place, and time.     ED Results / Procedures / Treatments   Labs (all labs ordered are listed, but only abnormal results are displayed) Labs Reviewed  URINALYSIS, ROUTINE W REFLEX MICROSCOPIC - Abnormal; Notable for the following components:      Result Value   Color, Urine STRAW (*)    All other components within normal limits  PREGNANCY, URINE - Abnormal; Notable for the following components:   Preg Test, Ur POSITIVE (*)    All other components within normal limits  HCG, QUANTITATIVE, PREGNANCY - Abnormal; Notable for the following components:   hCG, Beta Chain, Quant, S 191,975 (*)    All other components within normal limits    EKG None  Radiology No results found.  Procedures Procedures (including critical care time)  Medications Ordered in ED Medications - No data to display  ED Course  I have reviewed the triage vital signs and the nursing notes.  Pertinent labs & imaging results that were available during my care of the patient were reviewed by me and considered in my medical decision making (see chart for details).  Clinical Course as of Jul 20 1543  Mon Jul 20, 2019  1535 HCG, Newman NickelsBeta Chain, Quant, S(!): [phone] [JS]    Clinical Course User Index [JS] Claude MangesSoto, Maybel Dambrosio, [place]PA-C   MDM Rules/Calculators/A&P  Patient with no pertinent past medical history presents to the ED with complaints of lower back pain for a  week and a half.  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  She has not tried any medication for improvement in symptoms.  She also endorses some lower abdominal pain, currently eating during  her triage assessment.  There has been no nausea, vomiting, diarrhea.  Last menstrual period was November 1,  Lasted about a day and a half. Currently sexually active without any birth control but denies any chance of pregnancy.  She is overall well-appearing, nontoxic, vitals are within normal limits, no fevers, prior history of cancer, bowel bladder incontinence.  UA without any signs of infection, pregnancy test is positive.  An hCG level was obtained to further evaluate patient's pregnancy.   3:35 PM hCG level has resulted at 191, 975, these results were discussed with patient.  She reports she does not want an ultrasound at this time, she has been eating appropriately, drinking appropriately, states the cramping likely feels like her prior pregnancy.  I have advised her that we will need to confirm an IUP however patient has been in the emergency department for the past 3 hours and would like to be discharged at this time.  Will be provided with a letter for Medicaid in order to obtain further insurance for her pregnancy.  She does not have any prior history of miscarriages, ectopic pregnancies she is aware I cannot rule out an ectopic pregnancy at this time without imaging.  Patient would like discharge home at this time.  Referral for the women's clinic has been provided.   Portions of this note were generated with Lobbyist. Dictation errors may occur despite best attempts at proofreading.  Final Clinical Impression(s) / ED Diagnoses Final diagnoses:  Positive pregnancy test  [redacted] weeks gestation of pregnancy    Rx / DC Orders ED Discharge Orders    None       Janeece Fitting, Hershal Coria 07/20/19 1544    Carmin Muskrat, MD 07/21/19 1113

## 2019-07-20 NOTE — Discharge Instructions (Addendum)
Your hCG level on today's visit was 191,975   Please follow-up with the women's clinic in order to have your hCG rechecked along with obtain an ultrasound to confirm intrauterine pregnancy.  May only take Tylenol to help with your back pain, apply heating pad, rest.

## 2019-07-20 NOTE — ED Triage Notes (Signed)
Patient c/o lower back pain and lower abdominal pain x 1.5 weeks. Pain is intermittent, worse at work when lifting. Denies any urinary symptoms. Patient eating chips during triage.

## 2019-10-01 ENCOUNTER — Other Ambulatory Visit (HOSPITAL_COMMUNITY): Payer: Self-pay | Admitting: Obstetrics and Gynecology

## 2019-10-01 DIAGNOSIS — Z3689 Encounter for other specified antenatal screening: Secondary | ICD-10-CM

## 2019-10-01 DIAGNOSIS — Z3A21 21 weeks gestation of pregnancy: Secondary | ICD-10-CM

## 2019-10-27 ENCOUNTER — Ambulatory Visit (HOSPITAL_COMMUNITY): Payer: Medicaid Other | Attending: Obstetrics and Gynecology

## 2019-11-17 ENCOUNTER — Ambulatory Visit (HOSPITAL_COMMUNITY): Payer: Medicaid Other

## 2019-12-08 ENCOUNTER — Other Ambulatory Visit: Payer: Self-pay

## 2019-12-08 ENCOUNTER — Ambulatory Visit (HOSPITAL_COMMUNITY): Payer: BC Managed Care – PPO | Attending: Obstetrics and Gynecology

## 2019-12-08 DIAGNOSIS — Z3A21 21 weeks gestation of pregnancy: Secondary | ICD-10-CM | POA: Diagnosis present

## 2019-12-08 DIAGNOSIS — Z3689 Encounter for other specified antenatal screening: Secondary | ICD-10-CM | POA: Insufficient documentation

## 2019-12-08 DIAGNOSIS — Z363 Encounter for antenatal screening for malformations: Secondary | ICD-10-CM

## 2019-12-08 DIAGNOSIS — Z3A27 27 weeks gestation of pregnancy: Secondary | ICD-10-CM | POA: Diagnosis not present

## 2019-12-25 ENCOUNTER — Other Ambulatory Visit: Payer: Self-pay | Admitting: Obstetrics and Gynecology

## 2020-01-31 ENCOUNTER — Other Ambulatory Visit: Payer: Self-pay

## 2020-01-31 ENCOUNTER — Inpatient Hospital Stay (HOSPITAL_COMMUNITY)
Admission: AD | Admit: 2020-01-31 | Discharge: 2020-02-03 | DRG: 786 | Disposition: A | Discharge status: Home / Self Care | Payer: BC Managed Care – PPO | Attending: Obstetrics & Gynecology | Admitting: Obstetrics & Gynecology

## 2020-01-31 ENCOUNTER — Inpatient Hospital Stay (HOSPITAL_COMMUNITY): Payer: BC Managed Care – PPO | Admitting: Anesthesiology

## 2020-01-31 ENCOUNTER — Encounter (HOSPITAL_COMMUNITY): Payer: Self-pay | Admitting: Obstetrics and Gynecology

## 2020-01-31 ENCOUNTER — Encounter (HOSPITAL_COMMUNITY)
Admission: AD | Disposition: A | Discharge status: Home / Self Care | Payer: Self-pay | Source: Home / Self Care | Attending: Obstetrics & Gynecology

## 2020-01-31 DIAGNOSIS — O4593 Premature separation of placenta, unspecified, third trimester: Secondary | ICD-10-CM | POA: Diagnosis present

## 2020-01-31 DIAGNOSIS — O9912 Other diseases of the blood and blood-forming organs and certain disorders involving the immune mechanism complicating childbirth: Secondary | ICD-10-CM | POA: Diagnosis present

## 2020-01-31 DIAGNOSIS — D6949 Other primary thrombocytopenia: Secondary | ICD-10-CM | POA: Diagnosis not present

## 2020-01-31 DIAGNOSIS — O99334 Smoking (tobacco) complicating childbirth: Secondary | ICD-10-CM | POA: Diagnosis present

## 2020-01-31 DIAGNOSIS — O9902 Anemia complicating childbirth: Secondary | ICD-10-CM | POA: Diagnosis present

## 2020-01-31 DIAGNOSIS — O34211 Maternal care for low transverse scar from previous cesarean delivery: Secondary | ICD-10-CM | POA: Diagnosis present

## 2020-01-31 DIAGNOSIS — Z880 Allergy status to penicillin: Secondary | ICD-10-CM | POA: Diagnosis not present

## 2020-01-31 DIAGNOSIS — O34212 Maternal care for vertical scar from previous cesarean delivery: Secondary | ICD-10-CM | POA: Diagnosis present

## 2020-01-31 DIAGNOSIS — Z3A35 35 weeks gestation of pregnancy: Secondary | ICD-10-CM | POA: Diagnosis not present

## 2020-01-31 DIAGNOSIS — Z20822 Contact with and (suspected) exposure to covid-19: Secondary | ICD-10-CM | POA: Diagnosis present

## 2020-01-31 DIAGNOSIS — D509 Iron deficiency anemia, unspecified: Secondary | ICD-10-CM | POA: Diagnosis present

## 2020-01-31 DIAGNOSIS — Z8659 Personal history of other mental and behavioral disorders: Secondary | ICD-10-CM

## 2020-01-31 DIAGNOSIS — F17200 Nicotine dependence, unspecified, uncomplicated: Secondary | ICD-10-CM | POA: Diagnosis present

## 2020-01-31 DIAGNOSIS — O99892 Other specified diseases and conditions complicating childbirth: Secondary | ICD-10-CM

## 2020-01-31 DIAGNOSIS — Z9851 Tubal ligation status: Secondary | ICD-10-CM

## 2020-01-31 LAB — CBC
HCT: 30.1 % — ABNORMAL LOW (ref 36.0–46.0)
Hemoglobin: 9.3 g/dL — ABNORMAL LOW (ref 12.0–15.0)
MCH: 24 pg — ABNORMAL LOW (ref 26.0–34.0)
MCHC: 30.9 g/dL (ref 30.0–36.0)
MCV: 77.6 fL — ABNORMAL LOW (ref 80.0–100.0)
Platelets: 156 10*3/uL (ref 150–400)
RBC: 3.88 MIL/uL (ref 3.87–5.11)
RDW: 14.8 % (ref 11.5–15.5)
WBC: 8.9 10*3/uL (ref 4.0–10.5)
nRBC: 0.2 % (ref 0.0–0.2)

## 2020-01-31 LAB — SARS CORONAVIRUS 2 BY RT PCR (HOSPITAL ORDER, PERFORMED IN ~~LOC~~ HOSPITAL LAB): SARS Coronavirus 2: NEGATIVE

## 2020-01-31 LAB — ABO/RH: ABO/RH(D): O POS

## 2020-01-31 LAB — PREPARE RBC (CROSSMATCH)

## 2020-01-31 SURGERY — Surgical Case
Anesthesia: Spinal | Laterality: Bilateral | Wound class: Clean Contaminated

## 2020-01-31 MED ORDER — ONDANSETRON HCL 4 MG/2ML IJ SOLN
INTRAMUSCULAR | Status: DC | PRN
  Administered 2020-01-31: 4 mg via INTRAVENOUS

## 2020-01-31 MED ORDER — FENTANYL CITRATE (PF) 100 MCG/2ML IJ SOLN
INTRAMUSCULAR | Status: AC
  Filled 2020-01-31: qty 2

## 2020-01-31 MED ORDER — SODIUM CHLORIDE 0.9 % IR SOLN
Status: DC | PRN
  Administered 2020-01-31: 1

## 2020-01-31 MED ORDER — BETAMETHASONE SOD PHOS & ACET 6 (3-3) MG/ML IJ SUSP
12.0000 mg | INTRAMUSCULAR | Status: DC
  Administered 2020-01-31: 12 mg via INTRAMUSCULAR
  Filled 2020-01-31: qty 5

## 2020-01-31 MED ORDER — LACTATED RINGERS IV SOLN: INTRAVENOUS | Status: DC

## 2020-01-31 MED ORDER — FENTANYL CITRATE (PF) 100 MCG/2ML IJ SOLN
INTRAMUSCULAR | Status: DC | PRN
  Administered 2020-01-31: 15 ug via INTRATHECAL

## 2020-01-31 MED ORDER — OXYTOCIN-SODIUM CHLORIDE 30-0.9 UT/500ML-% IV SOLN
INTRAVENOUS | Status: AC
  Filled 2020-01-31: qty 500

## 2020-01-31 MED ORDER — OXYTOCIN-SODIUM CHLORIDE 30-0.9 UT/500ML-% IV SOLN
INTRAVENOUS | Status: DC | PRN
Start: 2020-01-31 — End: 2020-01-31
  Administered 2020-01-31: 300 mL via INTRAVENOUS

## 2020-01-31 MED ORDER — CEFAZOLIN SODIUM-DEXTROSE 2-4 GM/100ML-% IV SOLN
2.0000 g | INTRAVENOUS | Status: AC
  Administered 2020-01-31: 2 g via INTRAVENOUS
  Filled 2020-01-31: qty 100

## 2020-01-31 MED ORDER — MORPHINE SULFATE (PF) 0.5 MG/ML IJ SOLN
INTRAMUSCULAR | Status: DC | PRN
  Administered 2020-01-31: .15 mg via INTRATHECAL

## 2020-01-31 MED ORDER — DEXAMETHASONE SODIUM PHOSPHATE 4 MG/ML IJ SOLN
INTRAMUSCULAR | Status: DC | PRN
Start: 2020-01-31 — End: 2020-01-31
  Administered 2020-01-31: 4 mg via INTRAVENOUS

## 2020-01-31 MED ORDER — PHENYLEPHRINE HCL-NACL 20-0.9 MG/250ML-% IV SOLN
INTRAVENOUS | Status: AC
  Filled 2020-01-31: qty 250

## 2020-01-31 MED ORDER — DEXAMETHASONE SODIUM PHOSPHATE 4 MG/ML IJ SOLN
INTRAMUSCULAR | Status: AC
  Filled 2020-01-31: qty 1

## 2020-01-31 MED ORDER — LACTATED RINGERS IV BOLUS
1000.0000 mL | Freq: Once | INTRAVENOUS | Status: AC
  Administered 2020-01-31: 1000 mL via INTRAVENOUS

## 2020-01-31 MED ORDER — FAMOTIDINE IN NACL 20-0.9 MG/50ML-% IV SOLN
20.0000 mg | Freq: Once | INTRAVENOUS | Status: AC
  Administered 2020-01-31: 20 mg via INTRAVENOUS
  Filled 2020-01-31: qty 50

## 2020-01-31 MED ORDER — PHENYLEPHRINE HCL-NACL 20-0.9 MG/250ML-% IV SOLN
INTRAVENOUS | Status: DC | PRN
  Administered 2020-01-31: 60 ug/min via INTRAVENOUS

## 2020-01-31 MED ORDER — ONDANSETRON HCL 4 MG/2ML IJ SOLN
INTRAMUSCULAR | Status: AC
  Filled 2020-01-31: qty 2

## 2020-01-31 MED ORDER — BUPIVACAINE IN DEXTROSE 0.75-8.25 % IT SOLN
INTRATHECAL | Status: DC | PRN
  Administered 2020-01-31: 2 mL via INTRATHECAL

## 2020-01-31 MED ORDER — SOD CITRATE-CITRIC ACID 500-334 MG/5ML PO SOLN
30.0000 mL | Freq: Once | ORAL | Status: AC
  Administered 2020-01-31: 30 mL via ORAL
  Filled 2020-01-31: qty 30

## 2020-01-31 MED ORDER — MORPHINE SULFATE (PF) 0.5 MG/ML IJ SOLN
INTRAMUSCULAR | Status: AC
  Filled 2020-01-31: qty 10

## 2020-01-31 SURGICAL SUPPLY — 47 items
APL SKNCLS STERI-STRIP NONHPOA (GAUZE/BANDAGES/DRESSINGS) ×1
BARRIER ADHS 3X4 INTERCEED (GAUZE/BANDAGES/DRESSINGS) ×2 IMPLANT
BENZOIN TINCTURE PRP APPL 2/3 (GAUZE/BANDAGES/DRESSINGS) ×3 IMPLANT
BRR ADH 4X3 ABS CNTRL BYND (GAUZE/BANDAGES/DRESSINGS) ×1
CHLORAPREP W/TINT 26ML (MISCELLANEOUS) ×3 IMPLANT
CLAMP CORD UMBIL (MISCELLANEOUS) IMPLANT
CLOSURE WOUND 1/2 X4 (GAUZE/BANDAGES/DRESSINGS) ×1
CLOTH BEACON ORANGE TIMEOUT ST (SAFETY) ×3 IMPLANT
DRSG OPSITE POSTOP 4X10 (GAUZE/BANDAGES/DRESSINGS) ×3 IMPLANT
ELECT REM PT RETURN 9FT ADLT (ELECTROSURGICAL) ×3
ELECTRODE REM PT RTRN 9FT ADLT (ELECTROSURGICAL) ×1 IMPLANT
EXTRACTOR VACUUM M CUP 4 TUBE (SUCTIONS) IMPLANT
EXTRACTOR VACUUM M CUP 4' TUBE (SUCTIONS)
GLOVE BIO SURGEON STRL SZ7.5 (GLOVE) ×3 IMPLANT
GLOVE BIOGEL PI IND STRL 7.0 (GLOVE) ×2 IMPLANT
GLOVE BIOGEL PI IND STRL 7.5 (GLOVE) ×1 IMPLANT
GLOVE BIOGEL PI INDICATOR 7.0 (GLOVE) ×4
GLOVE BIOGEL PI INDICATOR 7.5 (GLOVE) ×2
GOWN STRL REUS W/TWL LRG LVL3 (GOWN DISPOSABLE) ×9 IMPLANT
KIT ABG SYR 3ML LUER SLIP (SYRINGE) IMPLANT
NDL HYPO 25X5/8 SAFETYGLIDE (NEEDLE) IMPLANT
NEEDLE HYPO 22GX1.5 SAFETY (NEEDLE) IMPLANT
NEEDLE HYPO 25X5/8 SAFETYGLIDE (NEEDLE) IMPLANT
NS IRRIG 1000ML POUR BTL (IV SOLUTION) ×3 IMPLANT
PACK C SECTION WH (CUSTOM PROCEDURE TRAY) ×3 IMPLANT
PAD ABD 7.5X8 STRL (GAUZE/BANDAGES/DRESSINGS) ×3 IMPLANT
PAD OB MATERNITY 4.3X12.25 (PERSONAL CARE ITEMS) ×3 IMPLANT
PENCIL SMOKE EVAC W/HOLSTER (ELECTROSURGICAL) ×3 IMPLANT
RTRCTR C-SECT PINK 25CM LRG (MISCELLANEOUS) ×3 IMPLANT
SPONGE GAUZE 4X4 12PLY STER LF (GAUZE/BANDAGES/DRESSINGS) ×4 IMPLANT
STRIP CLOSURE SKIN 1/2X4 (GAUZE/BANDAGES/DRESSINGS) ×2 IMPLANT
SUT CHROMIC 2 0 CT 1 (SUTURE) ×3 IMPLANT
SUT MNCRL AB 3-0 PS2 27 (SUTURE) ×3 IMPLANT
SUT PLAIN 0 NONE (SUTURE) ×3 IMPLANT
SUT PLAIN 2 0 XLH (SUTURE) ×3 IMPLANT
SUT VIC AB 0 CT1 36 (SUTURE) ×3 IMPLANT
SUT VIC AB 0 CTX 36 (SUTURE) ×9
SUT VIC AB 0 CTX36XBRD ANBCTRL (SUTURE) ×3 IMPLANT
SUT VIC AB 2-0 CT1 27 (SUTURE) ×3
SUT VIC AB 2-0 CT1 TAPERPNT 27 (SUTURE) IMPLANT
SUT VIC AB 2-0 CTX 36 (SUTURE) ×2 IMPLANT
SUT VIC AB 2-0 SH 27 (SUTURE) ×6
SUT VIC AB 2-0 SH 27XBRD (SUTURE) ×2 IMPLANT
SYR CONTROL 10ML LL (SYRINGE) IMPLANT
TOWEL OR 17X24 6PK STRL BLUE (TOWEL DISPOSABLE) ×3 IMPLANT
TRAY FOLEY W/BAG SLVR 14FR LF (SET/KITS/TRAYS/PACK) ×3 IMPLANT
WATER STERILE IRR 1000ML POUR (IV SOLUTION) ×3 IMPLANT

## 2020-01-31 NOTE — MAU Provider Note (Signed)
History     CSN: [id number]  Arrival date and time: 01/31/20 4665   First Provider Initiated Contact with Patient 01/31/20 1929      Chief Complaint  Patient presents with  . Vaginal Bleeding   Cindy Macias is a 34 y.o. [id number] at [redacted]w[redacted]d who presents today with contractions and vaginal bleeding. She states that around 1700 today she started to have intense, frequent contractions with bright red bleeding. She states that the bleeding has been like a period. She denies any LOF. She reports normal fetal movement. Patient has had 3 prior c-sections, including one classical c-section in 2005 (it is listed as classical in Epic, but I cannot locate records).   Vaginal Bleeding The patient's primary symptoms include pelvic pain and vaginal bleeding. This is a new problem. The current episode started today. The problem occurs constantly. The problem has been gradually worsening. The problem affects both sides. She is pregnant. Pertinent negatives include no chills, dysuria, fever, flank pain, nausea, urgency or vomiting. The vaginal discharge was bloody. The vaginal bleeding is heavier than menses. She has not been passing clots. She has not been passing tissue. Nothing aggravates the symptoms. She has tried nothing for the symptoms. Her past medical history is significant for a Cesarean section and a terminated pregnancy.    OB History    Gravida  6   Para  3   Term  3   Preterm      AB  2   Living  3     SAB  2   TAB      Ectopic      Multiple      Live Births  3           Past Medical History:  Diagnosis Date  . Anemia     Past Surgical History:  Procedure Laterality Date  . CESAREAN SECTION      Family History  Problem Relation Age of Onset  . Heart disease Maternal Grandmother   . Diabetes Neg Hx   . Stroke Neg Hx   . Cancer Neg Hx     Social History   Tobacco Use  . Smoking status: Current Every Day Smoker  . Smokeless tobacco: Never Used   Substance Use Topics  . Alcohol use: Not Currently  . Drug use: Yes    Types: Marijuana    Allergies: No Known Allergies  Medications Prior to Admission  Medication Sig Dispense Refill Last Dose  . cyclobenzaprine (FLEXERIL) 5 MG tablet Take 1 tablet (5 mg total) by mouth 3 (three) times daily as needed for muscle spasms. (Patient not taking: Reported on 03/19/2018) 15 tablet 0   . ferrous sulfate 325 (65 FE) MG tablet Take 1 tablet (325 mg total) by mouth 2 (two) times daily with a meal. 60 tablet 3   . metroNIDAZOLE (FLAGYL) 500 MG tablet Take 1 tablet (500 mg total) by mouth 3 (three) times daily. 21 tablet 0   . misoprostol (CYTOTEC) 200 MCG tablet Place 4 tablets (800 mcg total) vaginally once. (Patient not taking: Reported on 03/19/2018) 4 tablet 0   . oxyCODONE-acetaminophen (PERCOCET/ROXICET) 5-325 MG tablet Take 2 tablets by mouth every 6 (six) hours as needed for severe pain. (Patient not taking: Reported on 03/19/2018) 15 tablet 0     Review of Systems  Constitutional: Negative for chills and fever.  Gastrointestinal: Negative for nausea and vomiting.  Genitourinary: Positive for pelvic pain and vaginal bleeding. Negative for dysuria, flank  pain and urgency.  Neurological: Positive for dizziness and light-headedness.   Physical Exam   Blood pressure (!) 75/54, pulse 83, temperature 98.5 F (36.9 C), temperature source Oral, resp. rate 18, last menstrual period 05/31/2019, SpO2 100 %, unknown if currently breastfeeding.  Physical Exam Vitals and nursing note reviewed. Exam conducted with a chaperone present.  HENT:     Head: Normocephalic.  Cardiovascular:     Rate and Rhythm: Normal rate.  Pulmonary:     Effort: Pulmonary effort is normal.  Abdominal:     Palpations: Abdomen is soft.     Tenderness: There is no abdominal tenderness.  Genitourinary:    Comments: Dilation: 1 Effacement (%): Thick Exam by:: Thressa Sheller, CNM With BRB noted  Skin:    General: Skin  is warm and dry.  Neurological:     Mental Status: She is alert and oriented to person, place, and time.  Psychiatric:        Mood and Affect: Mood normal.        Behavior: Behavior normal.     NST:  Baseline: 145 Variability: moderate Accels: 15x15 Decels: none Toco: q 4-6 mins  Reactive/Appropriate for GA   MAU Course  Procedures  MDM  Hypotensive on arrival. Placed in trendelenburg, IV started, labs drawn. Cervical exam done and frank bleeding noted on exam. Blood pressure responding to position changes and fluid bolus. FHR remains reactive.   Assessment and Plan  Likely placental abruption [facility] notified  [facility] CNM on the unit to assume care of the patient.   Thressa Sheller DNP, CNM  01/31/20  8:08 PM

## 2020-01-31 NOTE — Anesthesia Procedure Notes (Signed)
Spinal  Patient location during procedure: OR Staffing Performed: anesthesiologist  Anesthesiologist: Watson Robarge E, MD Preanesthetic Checklist Completed: patient identified, IV checked, risks and benefits discussed, surgical consent, monitors and equipment checked, pre-op evaluation and timeout performed Spinal Block Patient position: sitting Prep: DuraPrep Patient monitoring: continuous pulse ox, blood pressure and heart rate Approach: midline Location: L3-4 Injection technique: catheter Needle Needle type: Pencan and Tuohy  Needle gauge: 25 G Needle length: 12.7 cm Catheter type: closed end flexible Catheter size: 19 g Additional Notes The patient was prepped and draped in the usual sterile fashion. A combined spinal-epidural technique was performed with a 9 cm 17g Tuohy needle and loss of resistance technique. After encountering LOR, a 12.7 cm 25g Pencan spinal needle was introduced via the Tuohy and clear CSF was aspirated prior to injection of local anesthetic. The spinal needle was removed and a 19 g flexible epidural catheter placed prior to Tuohy removal. Patient tolerated the procedure well without complications.     

## 2020-01-31 NOTE — Anesthesia Preprocedure Evaluation (Addendum)
Anesthesia Evaluation  Patient identified by MRN, date of birth, ID band Patient awake    Reviewed: Allergy & Precautions, NPO status , Patient's Chart, lab work & pertinent test results  History of Anesthesia Complications Negative for: history of anesthetic complications  Airway Mallampati: II  TM Distance: >3 FB Neck ROM: Full    Dental   Pulmonary Current Smoker, former smoker,    Pulmonary exam normal        Cardiovascular negative cardio ROS Normal cardiovascular exam     Neuro/Psych negative neurological ROS  negative psych ROS   GI/Hepatic negative GI ROS, Neg liver ROS,   Endo/Other  negative endocrine ROS  Renal/GU negative Renal ROS  negative genitourinary   Musculoskeletal negative musculoskeletal ROS (+)   Abdominal   Peds  Hematology negative hematology ROS (+) anemia , DOES NOT REFUSE BLOOD PRODUCTS,   Anesthesia Other Findings  3 prior C/S with classical incision, came to MAU for bleeding, first BP low (70s/40s) on arrival but all subsequent pressures normal  Reproductive/Obstetrics (+) Pregnancy                            Anesthesia Physical Anesthesia Plan  ASA: III and emergent  Anesthesia Plan: Combined Spinal and Epidural   Post-op Pain Management:    Induction:   PONV Risk Score and Plan: 3 and Ondansetron and Treatment may vary due to age or medical condition  Airway Management Planned: Natural Airway  Additional Equipment: None  Intra-op Plan:   Post-operative Plan:   Informed Consent: I have reviewed the patients History and Physical, chart, labs and discussed the procedure including the risks, benefits and alternatives for the proposed anesthesia with the patient or authorized representative who has indicated his/her understanding and acceptance.       Plan Discussed with:   Anesthesia Plan Comments:        Anesthesia Quick Evaluation

## 2020-01-31 NOTE — Op Note (Signed)
Cesarean Section  And Bilateral Tubal Ligation Procedure Note  Indications: abruptio placenta and previous uterine incision classical  Pre-operative Diagnosis: 35 week 0 day pregnancy.  Post-operative Diagnosis: same  Surgeon: Gerald Leitz M.D.  Assistants: Dale Brandon CNM  Anesthesia: Spinal anesthesia  ASA Class: 2   Procedure Details   The patient was seen in the Holding Room. The risks, benefits, complications, treatment options, and expected outcomes were discussed with the patient.  The patient concurred with the proposed plan, giving informed consent.  The site of surgery properly noted/marked. The patient was taken to Operating Room # A, identified as Binnie Kand and the procedure verified as C-Section Delivery. A Time Out was held and the above information confirmed.  After induction of anesthesia, the patient was draped and prepped in the usual sterile manner. A Pfannenstiel incision was made and carried down through the subcutaneous tissue to the fascia. Fascial incision was made and extended transversely. The fascia was separated from the underlying rectus tissue superiorly and inferiorly. The peritoneum was identified and entered. Peritoneal incision was extended longitudinally. The utero-vesical peritoneal reflection was incised transversely and the bladder flap was bluntly freed from the lower uterine segment. A low transverse uterine incision was made. Delivered from cephalic presentation was a 2160 gram Female with Apgar scores of 8 at one minute and 9 at five minutes. After the umbilical cord was clamped and cut cord blood was obtained for evaluation. The placenta was removed intact and appeared normal. The uterine outline, tubes and ovaries appeared normal. The uterine incision was closed with running locked sutures of 0 vicryl.. The left fallopian tube was grasped with babcock clamp. The mesosalpinx was incised with the bovie. The proximal portion of the tube was suture  ligated with 0 plain gut. The distal fallopian tube was suture ligated with 0 plain gut. The intervening segment was excised and sent to pathology. This was repeated on the right fallopian tube.  Hemostasis was observed. Lavage was carried out until clear. The fascia was then reapproximated with running sutures of 0 pds. The subcutaneous layer was reapproximated with 2-0 vicryl.  The skin was reapproximated with 4-0 vicryl.  Instrument, sponge, and needle counts were correct prior the abdominal closure and at the conclusion of the case.   Findings:  Large clot noted once uterine incision was made indicating placenta abruption. Female infant in the cephalic presentation with nuchal cord x 1. Normal appearing fallopian tubes and ovaries   Estimated Blood Loss:  161 mL         Drains: None         Total IV Fluids:  Per anesthesia ml         Specimens: Placenta, Fallopian Tube, Right, Left and Disposition:  Sent to Pathology          Implants: None         Complications:  None; patient tolerated the procedure well.         Disposition: PACU - hemodynamically stable.         Condition: stable  Attending Attestation: I performed the procedure.

## 2020-01-31 NOTE — MAU Note (Signed)
Patient reports to MAU for VB that started at 5pm she states she has filled up one and a half pads in one hour. contractions  started at 5pm, +movement. No problems with this pregnancy.

## 2020-01-31 NOTE — H&P (Addendum)
Cindy Macias is a 34 y.o. female, [phone], IUP at 35 weeks, presenting for repeat CS x4 th this one due to vaginal bleeding in thrid trimester. High suspicion for partial placenta abruption. See MAU note for further details. Been bleeding for several hours and endorses filled several pads. She endorses have cxt as well, rates them 9/10 on pain scale. Cindy Macias endorse + Fm. Low risk female. GBS unknown.   Pregnancy Problems Anemia (Hgb 9.5 at 17 weeks, recommend Fe.) history of 2 miscarriages history of cesarean section (x 3--2005 ([facility]: vertical inscision), 2008 ([facility]: transverse inscision), 2012 (with [facility]: transverse )) history of pre-eclampsia (2nd pregnancy, admitted 6 days pp, received Mag, home on Labetalol. Recommend ASA 81 mg.  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    Medications Prior to Admission  Medication Sig Dispense Refill Last Dose  . cyclobenzaprine (FLEXERIL) 5 MG tablet Take 1 tablet (5 mg total) by mouth 3 (three) times daily as needed for muscle spasms. (Patient not taking: Reported on 03/19/2018) 15 tablet 0   . ferrous sulfate 325 (65 FE) MG tablet Take 1 tablet (325 mg total) by mouth 2 (two) times daily with a meal. 60 tablet 3   . metroNIDAZOLE (FLAGYL) 500 MG tablet Take 1 tablet (500 mg total) by mouth 3 (three) times daily. 21 tablet 0   . misoprostol (CYTOTEC) 200 MCG tablet Place 4 tablets (800 mcg total) vaginally once. (Patient not taking: Reported on 03/19/2018) 4 tablet 0   . oxyCODONE-acetaminophen (PERCOCET/ROXICET) 5-325 MG tablet Take 2 tablets by mouth every 6 (six) hours as needed for severe pain. (Patient not taking: Reported on 03/19/2018) 15 tablet 0     Past Medical History:  Diagnosis Date  .  Anemia      No current facility-administered medications on file prior to encounter.   Current Outpatient Medications on File Prior to Encounter  Medication Sig Dispense Refill  . cyclobenzaprine (FLEXERIL) 5 MG tablet Take 1 tablet (5 mg total) by mouth 3 (three) times daily as needed for muscle spasms. (Patient not taking: Reported on 03/19/2018) 15 tablet 0  . ferrous sulfate 325 (65 FE) MG tablet Take 1 tablet (325 mg total) by mouth 2 (two) times daily with a meal. 60 tablet 3  . metroNIDAZOLE (FLAGYL) 500 MG tablet Take 1 tablet (500 mg total) by mouth 3 (three) times daily. 21 tablet 0  . misoprostol (CYTOTEC) 200 MCG tablet Place 4 tablets (800 mcg total) vaginally once. (Patient not taking: Reported on 03/19/2018) 4 tablet 0  . oxyCODONE-acetaminophen (PERCOCET/ROXICET) 5-325 MG tablet Take 2 tablets by mouth every 6 (six) hours as needed for severe pain. (Patient not taking: Reported on 03/19/2018) 15 tablet 0     No Known Allergies  History of present pregnancy: Cindy Macias Info/Preference:  Screening/Consents:  Labs:   EDD: Estimated Date of Delivery: 03/06/20  Establised: Patient's last menstrual period was 05/31/2019.  Anatomy Scan: Date: 10/21/2019 Placenta Location: anterior Genetic Screen: Panoroma:low risk female AFP:  First Tri: Quad:  Office: [facility]             First PNV: 17.4 wg Blood Type --/--/PENDING (07/04 1941)  Language: english Last PNV: 32.4 wg Rhogam    Flu Vaccine:  declined   Antibody PENDING (07/04 1941)  TDaP vaccine declined   GTT: Early: wnl Third Trimester: wnl  Feeding Plan: breast BTL: Yes consent signed 5/16 Rubella:  non-immune  Contraception: BTL VBAC: no RPR:   nr  Circumcision: ???   HBsAg:  neg  Pediatrician:  ???   HIV:   nr  Prenatal Classes: no Additional [place]: 5/12 GBS:  unknown(For PCN allergy, check sensitivities)       Chlamydia: neg    MFM Referral/Consult:  GC: neg  Support Person: no   PAP: ???  Pain Management: spinal Neonatologist Referral:   Hgb Electrophoresis:  AA  Birth Plan: no   Hgb NOB: 9.4    28W: 10.1   OB History    Gravida  6   Para  3   Term  3   Preterm      AB  2   Living  3     SAB  2   TAB      Ectopic      Multiple      Live Births  3          Past Medical History:  Diagnosis Date  . Anemia    Past Surgical History:  Procedure Laterality Date  . CESAREAN SECTION     Family History: family history includes Heart disease in her maternal grandmother. Social History:  reports that she has been smoking. She has never used smokeless tobacco. She reports previous alcohol use. She reports current drug use. Drug: Marijuana.   Prenatal Transfer Tool  Maternal Diabetes: No Genetic Screening: Normal Maternal Ultrasounds/Referrals: Normal Fetal Ultrasounds or other Referrals:  None Maternal Substance Abuse:  No Significant Maternal Medications:  None Significant Maternal Lab Results: Other:   ROS:  Review of Systems  Constitutional: Negative.   HENT: Negative.   Eyes: Negative.   Respiratory: Negative.   Cardiovascular: Negative.   Gastrointestinal: Positive for abdominal pain.  Genitourinary:       Vaginal bleeding   Musculoskeletal: Negative.   Skin: Negative.   Neurological: Negative.   Endo/Heme/Allergies: Negative.   Psychiatric/Behavioral: Negative.      Physical Exam: BP 117/71   Pulse 86   Temp 98.5 F (36.9 C) (Oral)   Resp 18   Ht 5\' 2"  (1.575 m)   Wt 87.8 kg   LMP 05/31/2019   SpO2 100%   BMI 35.39 kg/m   Physical Exam Constitutional:      Appearance: Normal appearance. She is normal weight.  HENT:     Head: Normocephalic and atraumatic.     Nose: Nose normal.     Mouth/Throat:     Mouth: Mucous membranes are moist.     Pharynx: Oropharynx is clear.  Eyes:     Extraocular Movements: Extraocular movements intact.     Conjunctiva/sclera: Conjunctivae normal.     Pupils: Pupils are equal, round, and reactive to light.  Cardiovascular:     Rate and  Rhythm: Normal rate and regular rhythm.     Pulses: Normal pulses.     Heart sounds: Normal heart sounds.  Pulmonary:     Effort: Pulmonary effort is normal.     Breath sounds: Normal breath sounds.  Abdominal:     General: Bowel sounds are normal.     Palpations: Abdomen is soft.  Genitourinary:    General: Normal vulva.     Comments: Gravida uterus, soft non-tender, vaginal bleeding light amount noted.  Musculoskeletal:        General: Normal range of motion.  Cervical back: Normal range of motion and neck supple.  Skin:    General: Skin is warm and dry.     Capillary Refill: Capillary refill takes less than 2 seconds.  Neurological:     General: No focal deficit present.     Mental Status: She is alert.  Psychiatric:        Mood and Affect: Mood normal.        Behavior: Behavior normal.      NST: FHR baseline 145 bpm, Variability: moderate, Accelerations:present, Decelerations:  Absent= Cat 1/Reactive UC:   irregular, every 2-5 minutes, lasting 60 seconds SVE:   Dilation: 1 Effacement (%): Thick Exam by:: Thressa Sheller, CNM, vertex verified by fetal sutures.  Leopold's: Position vertex, EFW 5.5lbs via leopold's.   Labs: Results for orders placed or performed during the hospital encounter of 01/31/20 (from the past 24 hour(s))  Type and screen [facility]     Status: None (Preliminary result)   Collection Time: 01/31/20  7:41 PM  Result Value Ref Range   ABO/RH(D) PENDING    Antibody Screen PENDING    Sample Expiration      02/03/2020,2359 Performed at [facility], [address], [place], [place] [address]   CBC     Status: Abnormal   Collection Time: 01/31/20  7:46 PM  Result Value Ref Range   WBC 8.9 4.0 - 10.5 K/uL   RBC 3.88 3.87 - 5.11 MIL/uL   Hemoglobin 9.3 (L) 12.0 - 15.0 g/dL   HCT 13.2 (L) 36 - 46 %   MCV 77.6 (L) 80.0 - 100.0 fL   MCH 24.0 (L) 26.0 - 34.0 pg   MCHC 30.9 30.0 - 36.0 g/dL   RDW 44.0 10.2 - 72.5 %    Platelets 156 150 - 400 K/uL   nRBC 0.2 0.0 - 0.2 %    Imaging:  No results found.  MAU Course: Orders Placed This Encounter  Procedures  . SARS Coronavirus 2 by RT PCR (hospital order, performed in [facility] hospital lab) Nasopharyngeal Nasopharyngeal Swab  . CBC  . RPR  . CBC  . Diet NPO time specified  . Place and maintain sequential compression device  . Informed Consent Details: Physician/Practitioner Attestation; Transcribe to consent form and obtain patient signature  . Initiate Pre-op Protocol  . Verify informed consent  . Place and maintain sequential compression device  . Instruct patient regarding incentive spirometry  . Clip operative site  . Instruct patient in turn, cough, deep breathe, and leg exercises  . Type and screen [facility]  . Prepare RBC (crossmatch)  . Type and screen [facility]  . Prepare RBC (crossmatch)  . Insert peripheral IV  . Insert peripheral IV  . Admit to Inpatient (patient's expected length of stay will be greater than 2 midnights or inpatient only procedure)   Meds ordered this encounter  Medications  . lactated ringers bolus 1,000 mL  . lactated ringers infusion  . sodium citrate-citric acid (ORACIT) solution 30 mL  . famotidine (PEPCID) IVPB 20 mg premix  . betamethasone acetate-betamethasone sodium phosphate (CELESTONE) injection 12 mg  . ceFAZolin (ANCEF) IVPB 2g/100 mL premix    Order Specific Question:   Indication:    Answer:   Surgical Prophylaxis    Assessment/Plan: ARELIA VOLPE is a 34 y.o. female, [phone], IUP at 35 weeks, presenting for repeat CS x4 th this one due to vaginal bleeding in thrid trimester. High  suspicion for partial placenta abruption. See MAU note for further details. Been bleeding for several hours and endorses filled several pads. She endorses have cxt as well, rates them 9/10 on pain scale. Cindy Macias endorse + Fm. Low risk female. GBS unknown.   Pregnancy Problems Anemia  (Hgb 9.5 at 17 weeks, recommend Fe.) history of 2 miscarriages history of cesarean section (x 3--2005 ([facility]: vertical inscision), 2008 ([facility]: transverse inscision), 2012 (with [facility]: transverse )) history of pre-eclampsia (2nd pregnancy, admitted 6 days pp, received Mag, home on Labetalol. Recommend ASA 81 mg.  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   Plan: Admit to OR per consult with DR Richardson Doppole Routine RCS [facility] orders Clip site LR ancef Pain epidural prn Anticipate urgent RCS with BTL  Medstar-Georgetown University Medical CenterJade Ed Rayson NP-C, CNM, MSN 01/31/2020, 8:10 PM

## 2020-01-31 NOTE — Transfer of Care (Signed)
Immediate Anesthesia Transfer of Care Note  Patient: Cindy Macias  Procedure(s) Performed: Repeat CESAREAN SECTION WITH BILATERAL TUBAL LIGATION (Bilateral )  Patient Location: PACU  Anesthesia Type:Spinal  Level of Consciousness: awake, alert  and oriented  Airway & Oxygen Therapy: Patient Spontanous Breathing  Post-op Assessment: Report given to RN and Post -op Vital signs reviewed and stable  Post vital signs: Reviewed and stable  Last Vitals:  Vitals Value Taken Time  BP 109/61 01/31/20 2230  Temp    Pulse 69 01/31/20 2231  Resp 29 01/31/20 2231  SpO2 97 % 01/31/20 2231  Vitals shown include unvalidated device data.  Last Pain:  Vitals:   01/31/20 1932  TempSrc:   PainSc: 9          Complications: No complications documented.

## 2020-02-01 ENCOUNTER — Encounter (HOSPITAL_COMMUNITY): Payer: Self-pay | Admitting: Obstetrics and Gynecology

## 2020-02-01 LAB — CBC
HCT: 26.4 % — ABNORMAL LOW (ref 36.0–46.0)
Hemoglobin: 8.1 g/dL — ABNORMAL LOW (ref 12.0–15.0)
MCH: 24 pg — ABNORMAL LOW (ref 26.0–34.0)
MCHC: 30.7 g/dL (ref 30.0–36.0)
MCV: 78.1 fL — ABNORMAL LOW (ref 80.0–100.0)
Platelets: 146 10*3/uL — ABNORMAL LOW (ref 150–400)
RBC: 3.38 MIL/uL — ABNORMAL LOW (ref 3.87–5.11)
RDW: 14.6 % (ref 11.5–15.5)
WBC: 12.8 10*3/uL — ABNORMAL HIGH (ref 4.0–10.5)
nRBC: 0 % (ref 0.0–0.2)

## 2020-02-01 LAB — RPR: RPR Ser Ql: NONREACTIVE

## 2020-02-01 MED ORDER — SIMETHICONE 80 MG PO CHEW
80.0000 mg | CHEWABLE_TABLET | ORAL | Status: DC
  Administered 2020-02-02 – 2020-02-03 (×2): 80 mg via ORAL
  Filled 2020-02-01 (×2): qty 1

## 2020-02-01 MED ORDER — FENTANYL CITRATE (PF) 100 MCG/2ML IJ SOLN
25.0000 ug | INTRAMUSCULAR | Status: DC | PRN
  Administered 2020-02-01: 50 ug via INTRAVENOUS
  Administered 2020-02-01: 25 ug via INTRAVENOUS

## 2020-02-01 MED ORDER — FENTANYL CITRATE (PF) 100 MCG/2ML IJ SOLN: 50.0000 ug | INTRAMUSCULAR | Status: DC | PRN

## 2020-02-01 MED ORDER — MAGNESIUM OXIDE 400 (241.3 MG) MG PO TABS
400.0000 mg | ORAL_TABLET | Freq: Every day | ORAL | Status: DC
  Administered 2020-02-01 – 2020-02-03 (×3): 400 mg via ORAL
  Filled 2020-02-01 (×3): qty 1

## 2020-02-01 MED ORDER — MEPERIDINE HCL 25 MG/ML IJ SOLN: 6.2500 mg | INTRAMUSCULAR | Status: DC | PRN

## 2020-02-01 MED ORDER — OXYTOCIN-SODIUM CHLORIDE 30-0.9 UT/500ML-% IV SOLN: 2.5000 [IU]/h | INTRAVENOUS | Status: AC

## 2020-02-01 MED ORDER — LACTATED RINGERS IV SOLN: INTRAVENOUS | Status: DC

## 2020-02-01 MED ORDER — NALBUPHINE HCL 10 MG/ML IJ SOLN: 5.0000 mg | Freq: Once | INTRAMUSCULAR | Status: DC | PRN

## 2020-02-01 MED ORDER — ONDANSETRON HCL 4 MG/2ML IJ SOLN: 4.0000 mg | Freq: Once | INTRAMUSCULAR | Status: DC | PRN

## 2020-02-01 MED ORDER — SODIUM CHLORIDE 0.9% FLUSH: 3.0000 mL | INTRAVENOUS | Status: DC | PRN

## 2020-02-01 MED ORDER — MENTHOL 3 MG MT LOZG: 1.0000 | LOZENGE | OROMUCOSAL | Status: DC | PRN

## 2020-02-01 MED ORDER — POLYSACCHARIDE IRON COMPLEX 150 MG PO CAPS
150.0000 mg | ORAL_CAPSULE | Freq: Every day | ORAL | Status: DC
  Administered 2020-02-01 – 2020-02-03 (×3): 150 mg via ORAL
  Filled 2020-02-01 (×3): qty 1

## 2020-02-01 MED ORDER — DIPHENHYDRAMINE HCL 25 MG PO CAPS: 25.0000 mg | ORAL_CAPSULE | Freq: Four times a day (QID) | ORAL | Status: DC | PRN

## 2020-02-01 MED ORDER — OXYCODONE HCL 5 MG PO TABS: 5.0000 mg | ORAL_TABLET | Freq: Once | ORAL | Status: DC | PRN

## 2020-02-01 MED ORDER — OXYCODONE HCL 5 MG/5ML PO SOLN: 5.0000 mg | Freq: Once | ORAL | Status: DC | PRN

## 2020-02-01 MED ORDER — DIPHENHYDRAMINE HCL 25 MG PO CAPS: 25.0000 mg | ORAL_CAPSULE | ORAL | Status: DC | PRN

## 2020-02-01 MED ORDER — WITCH HAZEL-GLYCERIN EX PADS: 1.0000 "application " | MEDICATED_PAD | CUTANEOUS | Status: DC | PRN

## 2020-02-01 MED ORDER — NALOXONE HCL 4 MG/10ML IJ SOLN
1.0000 ug/kg/h | INTRAVENOUS | Status: DC | PRN
  Filled 2020-02-01: qty 5

## 2020-02-01 MED ORDER — SENNOSIDES-DOCUSATE SODIUM 8.6-50 MG PO TABS
2.0000 | ORAL_TABLET | ORAL | Status: DC
  Administered 2020-02-02 – 2020-02-03 (×2): 2 via ORAL
  Filled 2020-02-01 (×2): qty 2

## 2020-02-01 MED ORDER — NALOXONE HCL 0.4 MG/ML IJ SOLN: 0.4000 mg | INTRAMUSCULAR | Status: DC | PRN

## 2020-02-01 MED ORDER — ACETAMINOPHEN 500 MG PO TABS
1000.0000 mg | ORAL_TABLET | Freq: Four times a day (QID) | ORAL | Status: DC
  Administered 2020-02-01 – 2020-02-03 (×10): 1000 mg via ORAL
  Filled 2020-02-01 (×10): qty 2

## 2020-02-01 MED ORDER — TETANUS-DIPHTH-ACELL PERTUSSIS 5-2.5-18.5 LF-MCG/0.5 IM SUSP: 0.5000 mL | Freq: Once | INTRAMUSCULAR | Status: DC

## 2020-02-01 MED ORDER — FENTANYL CITRATE (PF) 100 MCG/2ML IJ SOLN
INTRAMUSCULAR | Status: AC
  Filled 2020-02-01: qty 2

## 2020-02-01 MED ORDER — IBUPROFEN 600 MG PO TABS
600.0000 mg | ORAL_TABLET | Freq: Four times a day (QID) | ORAL | Status: DC
  Administered 2020-02-01 – 2020-02-03 (×7): 600 mg via ORAL
  Filled 2020-02-01 (×9): qty 1

## 2020-02-01 MED ORDER — ONDANSETRON HCL 4 MG/2ML IJ SOLN: 4.0000 mg | Freq: Three times a day (TID) | INTRAMUSCULAR | Status: DC | PRN

## 2020-02-01 MED ORDER — ZOLPIDEM TARTRATE 5 MG PO TABS: 5.0000 mg | ORAL_TABLET | Freq: Every evening | ORAL | Status: DC | PRN

## 2020-02-01 MED ORDER — KETOROLAC TROMETHAMINE 30 MG/ML IJ SOLN
30.0000 mg | Freq: Once | INTRAMUSCULAR | Status: AC | PRN
  Administered 2020-02-01: 30 mg via INTRAVENOUS

## 2020-02-01 MED ORDER — COCONUT OIL OIL: 1.0000 "application " | TOPICAL_OIL | Status: DC | PRN

## 2020-02-01 MED ORDER — DIPHENHYDRAMINE HCL 50 MG/ML IJ SOLN: 12.5000 mg | INTRAMUSCULAR | Status: DC | PRN

## 2020-02-01 MED ORDER — SIMETHICONE 80 MG PO CHEW: 80.0000 mg | CHEWABLE_TABLET | ORAL | Status: DC | PRN

## 2020-02-01 MED ORDER — KETOROLAC TROMETHAMINE 30 MG/ML IJ SOLN
INTRAMUSCULAR | Status: AC
  Filled 2020-02-01: qty 1

## 2020-02-01 MED ORDER — SIMETHICONE 80 MG PO CHEW
80.0000 mg | CHEWABLE_TABLET | Freq: Three times a day (TID) | ORAL | Status: DC
  Administered 2020-02-01 – 2020-02-03 (×7): 80 mg via ORAL
  Filled 2020-02-01 (×7): qty 1

## 2020-02-01 MED ORDER — NALBUPHINE HCL 10 MG/ML IJ SOLN
5.0000 mg | INTRAMUSCULAR | Status: DC | PRN
  Administered 2020-02-01: 5 mg via INTRAVENOUS
  Filled 2020-02-01: qty 1

## 2020-02-01 MED ORDER — NALBUPHINE HCL 10 MG/ML IJ SOLN: 5.0000 mg | INTRAMUSCULAR | Status: DC | PRN

## 2020-02-01 MED ORDER — OXYCODONE HCL 5 MG PO TABS
5.0000 mg | ORAL_TABLET | ORAL | Status: DC | PRN
  Administered 2020-02-01 – 2020-02-02 (×2): 5 mg via ORAL
  Administered 2020-02-02 (×2): 10 mg via ORAL
  Administered 2020-02-03: 5 mg via ORAL
  Administered 2020-02-03: 10 mg via ORAL
  Filled 2020-02-01 (×2): qty 1
  Filled 2020-02-01: qty 2
  Filled 2020-02-01: qty 1
  Filled 2020-02-01 (×2): qty 2

## 2020-02-01 MED ORDER — PRENATAL MULTIVITAMIN CH
1.0000 | ORAL_TABLET | Freq: Every day | ORAL | Status: DC
  Administered 2020-02-01 – 2020-02-03 (×3): 1 via ORAL
  Filled 2020-02-01 (×3): qty 1

## 2020-02-01 MED ORDER — KETOROLAC TROMETHAMINE 30 MG/ML IJ SOLN
30.0000 mg | Freq: Four times a day (QID) | INTRAMUSCULAR | Status: AC
  Administered 2020-02-01 (×3): 30 mg via INTRAVENOUS
  Filled 2020-02-01 (×3): qty 1

## 2020-02-01 MED ORDER — SCOPOLAMINE 1 MG/3DAYS TD PT72: 1.0000 | MEDICATED_PATCH | Freq: Once | TRANSDERMAL | Status: DC

## 2020-02-01 MED ORDER — DIBUCAINE (PERIANAL) 1 % EX OINT: 1.0000 "application " | TOPICAL_OINTMENT | CUTANEOUS | Status: DC | PRN

## 2020-02-01 NOTE — Clinical Social Work Maternal (Signed)
CLINICAL SOCIAL WORK MATERNAL/CHILD NOTE  Patient Details  Name: Cindy Macias MRN: [id number] Date of Birth: 07-30-1986  Date:  02/01/2020  Clinical Social Worker Initiating Note:  Elijio Miles Date/Time: Initiated:  02/01/20/1134     Child's Name:  Undecided   Biological Parents:  Mother, Father Cindy Macias DOB: 07/16/1984)   Need for Interpreter:  None   Reason for Referral:  Behavioral Health Concerns, Current Substance Use/Substance Use During Pregnancy    Address:  [address] [place] [address]    Phone number:  [phone] (home)     Additional phone number:   Household Members/Support Persons (HM/SP):   Household Member/Support Person 1, Household Member/Support Person 2, Household Member/Support Person 3, Household Member/Support Person 4, Household Member/Support Person 5   HM/SP Name Relationship DOB or Age  HM/SP -1 Cindy Macias FOB 07/16/1984  HM/SP -2 Domingo Cocking (staying with [name]'s friend) Daughter 02/24/2004  HM/SP -3 Aalyiah Camberos (staying with aunt) Daughter 02/01/2007  HM/SP -[place] (staying with his father) Son 09/13/2010  HM/SP -[place] (staying with her mother - Joley [place] and aunt) Granddaughter 04/16/2019  HM/SP -6        HM/SP -7        HM/SP -8          Natural Supports (not living in the home):  Spouse/significant other, Parent, Extended Family, Immediate Family   Professional Supports: Case Manager/Social Worker ([place])   Employment: Part-time   Type of Work: [employer]   Education:  [employer] school graduate   Homebound arranged:    Museum/gallery curator Resources:  Multimedia programmer   Other Resources:  Physicist, medical , [place] Considerations Which May Impact Care:    Strengths:  Ability to meet basic needs , Pediatrician chosen   Psychotropic Medications:         Pediatrician:    [place]olic[place] area  Pediatrician List:   [facility] (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  CSW met with [name] to offer support and complete assessment.    [name] resting in bed holding infant skin-to-skin with FOB present at bedside, when CSW entered the room. FOB left shortly after CSW's arrival and did not return during assessment. [name] very pleasant and easy to engage throughout visit. CSW introduced self and explained reason for consult to which [name] expressed understanding. CSW inquired about [name]'s mental health history to which [name] acknowledged history of anxiety, PTSD, stress and depression starting from the age of 6. [name] denied any current medications or counseling but did state she has participated in counseling before. [name] not interested in medications or counseling at this time but was open to resources given. CSW addressed previous PPD/A to which [name] acknowledged experiencing it after her last pregnancy. [name] attributed much of her PPD to situational stressors going on at time. [name] disclosed that current situation is "worse" than it was then as she is dealing with homelessness.  Per [name], she uses the shelter's address as her personal address but has not been staying there. [name] shared she is currently staying with FOB and has been as her due date neared because she knew FOB would be her primary support. [name] reported she will often pay for rooms at a hotel so that she and her children can be together. [name] confirmed she still has custody of all of her children and that they stay with different support people when [name] is  staying with FOB. [name] shared she is on multiple wait lists waiting for housing to become available and stated she has the necessary funds. [name] disclosed she has a current open CPS case with [facility] and gave CSW permission to reach out to worker. CPS closed today due to the holiday. CSW to follow up with Alvira Philips with [facility] tomorrow 7/6. CSW stressed to [name] the importance of monitoring for any mental health symptoms that may arise due to situational stressors and provided review of the baby blues period vs. perinatal mood disorders, discussed treatment and gave resources for mental health follow up if concerns arise. CSW recommended self-evaluation during the postpartum time period using the New Mom Checklist from Postpartum Progress and encouraged [name] to contact a medical professional if symptoms are noted at any time. [name] denied any current SI, HI or DV and reported having good support from FOB, her other children's fathers, a cousin, her aunt, and her sisters.   CSW inquired about [name]'s substance use during pregnancy to which [name] acknowledged use of marijuana during her pregnancy with last use being 7/1. CSW informed [name] of Hospital Drug Policy and explained UDS came back negative but that CDS was still pending and that CPS would be informed of results if warranted.   [name] stated they do not have all essential items for infant at this time but that they will have them prior to discharge. CSW informed [name] that if they have any difficulties getting items to let CSW know. CSW to follow up with [name] prior to discharge. CSW provided review of Sudden Infant Death Syndrome (SIDS) precautions and safe sleeping habits.    CSW to follow up with Alvira Philips with [facility] CPS tomorrow, 7/6, regarding open case and will continue to monitor CDS results and notify them of results, if warranted.   CSW Plan/Description:  Sudden Infant Death Syndrome (SIDS) Education, Perinatal Mood and  Anxiety Disorder (PMADs) Education, [place], CSW Awaiting CPS Disposition Plan, CSW Will Continue to Monitor Umbilical Cord Tissue Drug Screen Results and Make Report if Cindy Spurling, LCSW 02/01/2020, 2:41 PM

## 2020-02-01 NOTE — Lactation Note (Signed)
This note was copied from a baby's chart. Lactation Consultation Note Baby 4 hrs old at time of consult. Mom stated baby BF after delivery. Baby has been given formula. LC attempted to latch baby. Baby would just hold nipple in mouth. Not interested in BF at this time.  Very jittery. Lab into draw glucose. Mom has a 33 yr old that she BF for 1 yr, 13 yr ols and 46 yr old mom BF each for 10 months.  Mom has everted nipples, everts more w/stimulation. Hand expression taught w/bare glistening noted. Mom stated she really didn't have a change in her breast tissue.  LPI information sheet is reviewed stressing importance of I&O. Strongly encouraged mom to supplement w/22 cal. Similac. Mom is fine with that. Mom encouraged to feed baby 8-12 times/24 hours and with feeding cues.  Mom shown how to use DEBP & how to disassemble, clean, & reassemble parts. Mom knows to pump q3h for 15-20 min. Mom encouraged to waken baby for feeds.  Mom sleepy and tired. Has no support person at bedside. Encouraged to call for assistance if needed. Encouraged to attempt to BF before formula feeding.  Lactation brochure given. Mom has WIC but doesn't have a DEBP. LC will send referral  Via fax.    Patient Name: Boy Leiloni Smithers EPPIR'[id number] Date: 02/01/2020 Reason for consult: Initial assessment;Infant < 6lbs;Late-preterm 34-36.6wks   Maternal Data Has patient been taught Hand Expression?: Yes Does the patient have breastfeeding experience prior to this delivery?: Yes  Feeding Feeding Type: Formula Nipple Type: Slow - flow  LATCH Score Latch: Too sleepy or reluctant, no latch achieved, no sucking elicited.  Audible Swallowing: None  Type of Nipple: Everted at rest and after stimulation  Comfort (Breast/Nipple): Soft / non-tender  Hold (Positioning): Full assist, staff holds infant at breast  LATCH Score: 4  Interventions Interventions: Breast feeding basics reviewed;Support pillows;Assisted with  latch;Position options;Skin to skin;Breast massage;Hand express;Breast compression;Adjust position;DEBP;Hand pump  Lactation Tools Discussed/Used Tools: Pump Breast pump type: Double-Electric Breast Pump WIC Program: Yes Pump Review: Setup, frequency, and cleaning;Milk Storage Initiated by:: Peri Jefferson RN IBCLC Date initiated:: 02/01/20   Consult Status Consult Status: Follow-up Date: 02/01/20 Follow-up type: In-patient    Jawara Latorre, Diamond Nickel 02/01/2020, 2:18 AM

## 2020-02-01 NOTE — Anesthesia Postprocedure Evaluation (Signed)
Anesthesia Post Note  Patient: Cindy Macias  Procedure(s) Performed: Repeat CESAREAN SECTION WITH BILATERAL TUBAL LIGATION (Bilateral )     Patient location during evaluation: PACU Anesthesia Type: Combined Spinal/Epidural Level of consciousness: awake and alert Pain management: pain level controlled Vital Signs Assessment: post-procedure vital signs reviewed and stable Respiratory status: spontaneous breathing, nonlabored ventilation and respiratory function stable Cardiovascular status: blood pressure returned to baseline and stable Postop Assessment: no apparent nausea or vomiting Anesthetic complications: no   No complications documented.  Last Vitals:  Vitals:   02/01/20 0305 02/01/20 0412  BP: 113/77 113/71  Pulse: 76 67  Resp: 18 18  Temp: 36.4 C 36.6 C  SpO2: 100% 100%    Last Pain:  Vitals:   02/01/20 0412  TempSrc: Oral  PainSc: 3    Pain Goal: Patients Stated Pain Goal: 3 (02/01/20 0412)                 Lowella Curb

## 2020-02-01 NOTE — Progress Notes (Signed)
Cindy Macias [id number] Postpartum Postoperative Day # 1  SOFIE SCHENDEL, [id number], [redacted]w[redacted]d, S/P repeat CS LT Cesarean Section due to vaginal bleeding at [redacted] weeks gestation with partial abruption. Pt arrived with c/o bleeding through three pads 2 hours prior to arrival with hypotension and tachycardia, suspect abruption and proceeded with repeat CS after 1 dose of BMZ.  H/O three pior CS one vertical incision and 2 transverse incisions. Pt h/o anemia 9.3 upon admission and drop to 8.1, QBL . Baby Female unknown desires for circ waiting to talk with peds.   Subjective: Patient up ad lib, denies syncope or dizziness. Reports consuming regular diet without issues and denies N/V. Patient reports 0 bowel movement + passing flatus.  Denies issues with urination and foley bag still in place and reports bleeding is "lighter."  Patient is breast and bottle feeding and reports going well.  Desires BTL which was performed during surgery for postpartum contraception.  Pain is being appropriately managed with use of po meds.   Objective: Patient Vitals for the past 24 hrs:  BP Temp Temp src Pulse Resp SpO2 Height Weight  02/01/20 0820 111/69 97.8 F (36.6 C) Oral 73 18 99 % -- --  02/01/20 0412 113/71 97.8 F (36.6 C) Oral 67 18 100 % -- --  02/01/20 0305 113/77 97.6 F (36.4 C) Oral 76 18 100 % -- --  02/01/20 0205 111/68 98 F (36.7 C) Axillary (!) 59 18 100 % -- --  02/01/20 0044 113/65 -- -- (!) 57 16 98 % -- --  02/01/20 0030 118/76 -- -- 65 14 98 % -- --  02/01/20 0015 117/82 -- -- (!) 56 13 100 % -- --  02/01/20 0000 120/75 -- -- (!) 56 16 99 % -- --  01/31/20 2345 126/85 -- -- 60 13 100 % -- --  01/31/20 2330 (!) 119/54 -- -- 66 16 100 % -- --  01/31/20 2315 121/67 -- -- 68 20 98 % -- --  01/31/20 2300 117/61 -- -- 61 14 100 % -- --  01/31/20 2245 110/73 -- -- 73 (!) 23 91 % -- --  01/31/20 2230 109/61 98.1 F (36.7 C) Oral 65 17 99 % -- --  01/31/20 2036 126/74 -- -- 82 16 -- -- --   01/31/20 2035 -- -- -- -- -- 100 % -- --  01/31/20 2017 (!) 117/59 -- -- 81 -- -- -- --  01/31/20 2015 -- -- -- -- -- 100 % -- --  01/31/20 2010 -- -- -- -- -- 100 % -- --  01/31/20 2007 -- -- -- -- -- -- 5\' 2"  (1.575 m) 87.8 kg  01/31/20 2005 -- -- -- -- -- 100 % -- --  01/31/20 2000 -- -- -- -- -- 100 % -- --  01/31/20 1955 -- -- -- -- -- 100 % -- --  01/31/20 1951 117/71 -- -- 86 -- -- -- --  01/31/20 1940 116/68 -- -- 90 -- -- -- --  01/31/20 1935 95/78 -- -- (!) 137 -- 100 % -- --  01/31/20 1931 -- 98.5 F (36.9 C) Oral -- -- -- -- --  01/31/20 1928 (!) 75/54 -- -- 83 18 100 % -- --     Physical Exam:  General: alert, cooperative, appears stated age and no distress Mood/Affect: Happy Lungs: clear to auscultation, no wheezes, rales or rhonchi, symmetric air entry.  Heart: normal rate, regular rhythm, normal S1, S2, no murmurs, rubs, clicks or gallops.  Breast: breasts appear normal, no suspicious masses, no skin or nipple changes or axillary nodes. Abdomen:  + bowel sounds, soft, non-tender Incision: healing well, no significant drainage, no dehiscence, no significant erythema, Honeycomb dressing  Uterine Fundus: firm, involution -1 Lochia: appropriate Skin: Warm, Dry. DVT Evaluation: No evidence of DVT seen on physical exam. Negative Homan's sign. No cords or calf tenderness. No significant calf/ankle edema.  Labs: Recent Labs    01/31/20 1946 02/01/20 0457  HGB 9.3* 8.1*  HCT 30.1* 26.4*  WBC 8.9 12.8*    CBG (last 3)  No results for input(s): GLUCAP in the last 72 hours.   I/O: I/O last 3 completed shifts: In: 3529.8 [P.O.:1080; I.V.:2449.8] Out: 1347 [Urine:1150; Blood:197]   Assessment Postpartum Postoperative Day # 1  Cindy Macias, [id number], [redacted]w[redacted]d, S/P repeat CS LT Cesarean Section due to vaginal bleeding at [redacted] weeks gestation with partial abruption. Pt arrived with c/o bleeding through three pads 2 hours prior to arrival with hypotension and  tachycardia, suspect abruption and proceeded with repeat CS after 1 dose of BMZ.  H/O three pior CS one vertical incision and 2 transverse incisions. Pt h/o anemia 9.3 upon admission and drop to 8.1, QBL , hemodynamically stable. Baby Female unknown desires for circ waiting to talk with peds. Pt stable. -1 Involution. Breast and bottle Feeding.   Plan: Continue other mgmt as ordered VTE Prophylactics: SCD, ambulated as tolerates.  Pain control: Motrin/Tylenol/Narcotics PRN Breastfeeding, Lactation consult and Contraception BTL.  Plan for d/c to home  Dr. Richardson Dopp to be updated on patient status  [employer] NP-C, CNM 02/01/2020, 10:27 AM

## 2020-02-01 NOTE — Lactation Note (Signed)
This note was copied from a baby's chart. Lactation Consultation Note  Patient Name: Cindy Macias RAQTM'A Date: 02/01/2020 Reason for consult: Follow-up assessment;Late-preterm 34-36.6wks P4, 17 hour LPTI 2% weight loss. Per mom, she feels breastfeeding is going well.  LC entered room, mom attempting to latch infant bent over in bed, nipples tilted downward and without pillow support. LC put pillows under infant and support for mom's arm.  Mom re-latched infant on her right breast using the football hold position, bring infant to breast instead of her leaning forward towards infant. Infant latched with wide mouth, nose and tongue touching breast, swallows observed as mom was doing breast compressions and stimulating infant to stay awake to Breastfeed. Infant was still breastfeeding after 10 minutes when LC left the room, mom plans to supplement infant with 10 mls of Similac Neosure 22 kcal after latching infant at the breast for each feeding within the first 24 hours of life. LC reviewed LPTI policy that was given to mom earlier by [facility]. Mom will start using the DEBP every 3 hours for 15 minutes on initial setting  to help in establishing her milk supply and due to infant being LPTI. Mom will continue to Breastfeed according to cues, on demand, not allow infant go past 3 hours without feeding and limit feeding to 30 minutes or less due infant being LPTI. Mom will continue to do STS while Breastfeeding infant.   Maternal Data    Feeding Feeding Type: Breast Fed  LATCH Score Latch: Repeated attempts needed to sustain latch, nipple held in mouth throughout feeding, stimulation needed to elicit sucking reflex.  Audible Swallowing: A few with stimulation  Type of Nipple: Everted at rest and after stimulation  Comfort (Breast/Nipple): Soft / non-tender  Hold (Positioning): Assistance needed to correctly position infant at breast and maintain latch.  LATCH Score:  7  Interventions Interventions: Assisted with latch;Breast compression;Adjust position;Skin to skin;Breast massage;Support pillows  Lactation Tools Discussed/Used Breast pump type: Double-Electric Breast Pump   Consult Status Consult Status: Follow-up Date: 02/02/20 Follow-up type: In-patient    Danelle Earthly 02/01/2020, 4:46 PM

## 2020-02-02 DIAGNOSIS — D509 Iron deficiency anemia, unspecified: Secondary | ICD-10-CM | POA: Diagnosis present

## 2020-02-02 DIAGNOSIS — Z9851 Tubal ligation status: Secondary | ICD-10-CM

## 2020-02-02 DIAGNOSIS — D6949 Other primary thrombocytopenia: Secondary | ICD-10-CM | POA: Diagnosis not present

## 2020-02-02 DIAGNOSIS — O34212 Maternal care for vertical scar from previous cesarean delivery: Secondary | ICD-10-CM | POA: Diagnosis present

## 2020-02-02 DIAGNOSIS — Z8759 Personal history of other complications of pregnancy, childbirth and the puerperium: Secondary | ICD-10-CM

## 2020-02-02 DIAGNOSIS — Z2839 Other underimmunization status: Secondary | ICD-10-CM

## 2020-02-02 MED ORDER — MEASLES, MUMPS & RUBELLA VAC IJ SOLR
0.5000 mL | Freq: Once | INTRAMUSCULAR | Status: DC
  Filled 2020-02-02: qty 0.5

## 2020-02-02 NOTE — Progress Notes (Signed)
Subjective: POD# 2 Information for the patient's newborn:  Cindy Macias, Cindy Macias [[id number]]  female    34 Name [place] Circumcision desires in-pt, baby rooming in w/ mom @ 35 wks  Reports feeling good, denies fatigue, lightheadedness, or dizziness w/ activity Feeding: breast and bottle Reports tolerating PO and denies N/V, foley removed, ambulating and urinating w/o difficulty  Pain controlled with PO meds Passing flatus  Vaginal bleeding is normal, no clots     Objective:  VS:  Vitals:   02/01/20 1240 02/01/20 1615 02/01/20 2223 02/02/20 0453  BP: 114/74 113/69 117/63 125/81  Pulse: 68 70 76 71  Resp: _0 Temp: 98 F (36.7 C)  97.7 F (36.5 C) 97.7 F (36.5 C)  TempSrc: Oral   Oral  SpO2: 100% 98% 100%   Weight:      Height:        Intake/Output Summary (Last 24 hours) at 02/02/2020 0801 Last data filed at 02/01/2020 2020 Gross per 24 hour  Intake --  Output 3425 ml  Net -3425 ml     Recent Labs    01/31/20 1946 02/01/20 0457  WBC 8.9 12.8*  HGB 9.3* 8.1*  HCT 30.1* 26.4*  PLT 156 146*    Blood type: --/--/O POS, O POS Performed at [facility], [place] [address] [place], [place] [address]  [phone]/04 1941) Rubella:   non-immune   Physical Exam:  General: alert, appears stated age and no distress CV: Regular rate and rhythm or without murmur or extra heart sounds Resp: clear Abdomen: soft, nontender, normal bowel sounds Incision: pressure dressing removed, serosanguinous drainage on 25% of honeycomb dressing Perineum: intact Uterine Fundus: firm, below umbilicus, nontender Lochia: minimal Ext: neg for edema, cords, tenderness, and pain   Assessment/Plan: 34 y.o.   POD# 1 [id number]  Hx of prev C/S x3, admitted w/ vaginal bleeding and possible abruption/ Postpartum care following repeat C/S and permanent sterilization                   IDA          -Asymptomatic, notify provider for symptomatic anemia Rubella non-immune     -MMR vaccine prior  to discharge Hx of PP depression      [place] prior to discharge and follow-up w/ [facility] in 2 weeks Continue routine PP care orders  Arrie Eastern, MSN, CNM 02/02/2020, 8:01 AM

## 2020-02-02 NOTE — Lactation Note (Signed)
This note was copied from a baby's chart. Lactation Consultation Note  Patient Name: Cindy Macias NOIBB'C Date: 02/02/2020    Infant is 36 hrs old. When I entered room, Mom was bottle feeding with yellow slow-flow nipple & infant's swallows sounded comfortable.   Mom says she hears infant swallowing when he's at the breast. Mom says she can also tell when infant is no longer swallowing. I suggested that when infant is no longer swallowing, to take him off breast, give bottle, and then pump.   I confirmed with Mom that we wanted her to increase supplementing amounts as now infant is older, but I also clarified for her that if infant desires more volume, then allow him to have it.   Mom expressed interest in pumping today & possibly wanting my help with that. I told Mom how to reach me so that I can return.  Lurline Hare [facility] 02/02/2020, 7:54 AM

## 2020-02-02 NOTE — Progress Notes (Signed)
CSW has attempted to reach out to Sarah Bowden with [place] CPS regarding [name]'s open CPS case. CSW awaiting return call.  12:40pm - CSW received return call from Sarah Bowden who confirmed case is still open. Sarah reported she would like to meet with [name] prior to discharge and intends to meet with [name] on 7/7.   Rosalva Neary, LCSW [facility] [phone]  

## 2020-02-02 NOTE — Lactation Note (Signed)
This note was copied from a baby's chart. Lactation Consultation Note  Patient Name: Cindy Macias [id number] Date: 02/02/2020   Infant is 83 hrs old. RN let me know that infant just latched. I entered room; infant was suckling at breast, but I did not note any swallows (verified by cervical auscultation). I explained to Mom this can happen with late preterm infants. I also noted some dimpling in the cheeks while latched.   I suggested that she give a bottle as soon as she is done & to increase volumes. I also recommended that Mom not do long feedings at the breast if he is not actively transferring milk.   Mom was Facetiming with her other children, so I will attempt to return again later to speak with Mom more.   Lurline Hare [facility] 02/02/2020, 12:00 PM

## 2020-02-03 LAB — SURGICAL PATHOLOGY

## 2020-02-03 MED ORDER — IBUPROFEN 600 MG PO TABS: 600.0000 mg | ORAL_TABLET | Freq: Four times a day (QID) | ORAL | 0 refills | Status: AC

## 2020-02-03 MED ORDER — MAGNESIUM OXIDE 400 (241.3 MG) MG PO TABS: 400.0000 mg | ORAL_TABLET | Freq: Every day | ORAL | 2 refills | Status: AC

## 2020-02-03 MED ORDER — ACETAMINOPHEN 500 MG PO TABS: 1000.0000 mg | ORAL_TABLET | Freq: Four times a day (QID) | ORAL | 0 refills | Status: AC

## 2020-02-03 MED ORDER — POLYSACCHARIDE IRON COMPLEX 150 MG PO CAPS: 150.0000 mg | ORAL_CAPSULE | Freq: Every day | ORAL | 2 refills | Status: AC

## 2020-02-03 MED ORDER — OXYCODONE HCL 5 MG PO TABS: 5.0000 mg | ORAL_TABLET | ORAL | 0 refills | Status: AC | PRN

## 2020-02-03 NOTE — Progress Notes (Signed)
CSW provided escort for Cindy Macias with [facility] CPS to [facility] room. CSW spoke with Maralyn Sago after meeting with [name]. Per Maralyn Sago, no barriers to infant discharging to [name] once medically ready.   Lear Ng, LCSW [facility] [phone]

## 2020-02-03 NOTE — Progress Notes (Addendum)
CSW attempted to reach out to Cindy Macias with [place] CPS regarding what time she plans to meet with patient this morning. Cindy unable to talk at the time but stated she would call CSW back. CSW to await return call.   11:23 - CSW reached out to Cindy Macias after not receiving return call. Per Cindy, she would be by to see [name] in the next couple of hours.   Cindy Plotts, LCSW [facility] [phone]  

## 2020-02-03 NOTE — Lactation Note (Signed)
This note was copied from a baby's chart. Lactation Consultation Note Baby 61 hrs old. Mom holding baby STS. Asked mom if she has been pumping. Mom stated she hasn't pumped in a while. Encouraged to pump. Reviewed how much to supplement. When mom pumped she collected colostrum. Mom was smiling all  Excited about collecting colostrum. Encouraged to give that first then formula. Reviewed how much to give at hrs of age. Mom stated baby hasn't been latching well.  Encouraged to go to breast before supplementing. Encouraged to call for latch assistance. Patient Name: Cindy Macias [id number] Date: 02/03/2020 Reason for consult: Follow-up assessment;Infant < 6lbs;Late-preterm 34-36.6wks   Maternal Data    Feeding Feeding Type: Breast Milk Nipple Type: Slow - flow  LATCH Score                   Interventions    Lactation Tools Discussed/Used     Consult Status Consult Status: Follow-up Date: 02/03/20 (in pm) Follow-up type: In-patient    Dyllan Kats, Diamond Nickel 02/03/2020, 12:21 AM

## 2020-02-03 NOTE — Lactation Note (Signed)
This note was copied from a baby's chart. Lactation Consultation Note  Patient Name: Cindy Macias XBWIO'M Date: 02/03/2020 Reason for consult: Follow-up assessment  P4 mother whose infant is now 41 hours old.  This is a LPTI at 35+0 weeks with a CGA of 35+3 weeks.  Mother has breast feeding experience with her other three children ranging in length from 10-12 months each.  Mother had no immediate questions/concerns related to breast feeding.  She has been breast feeding, supplementing with Similac 22 calorie formula and pumping after feedings for 15 minutes.  Mother stated she pumped for 30 minutes after the last feeding and obtained 7 mls of EBM.  Mother has been able to hear audible swallows with breast feeding.  Discussed the expected volume supplementation now that baby is older.  Father stated that he recently consumed 30 mls of formula.  Parents are able to feed without difficulty.    Engorgement prevention/treatment reviewed.  Mother stated she has experienced this in the past.  She has a manual pump at bedside but does not currently have a DEBP.  She has a [facility] appointment scheduled for tomorrow morning and is anticipating being able to obtain a DEBP.  Discussed our [facility] loaner program and mother is interested.  She will call me back when she is ready with the $30 to obtain the pump.  Father left to care for the dogs but will return soon.     Maternal Data    Feeding    LATCH Score                   Interventions    Lactation Tools Discussed/Used     Consult Status Consult Status: Complete Date: 02/03/20 Follow-up type: Call as needed    Lorrane Mccay R Dandrea Medders 02/03/2020, 9:00 AM

## 2020-02-03 NOTE — Discharge Summary (Signed)
OB Discharge Summary  Patient Name: Cindy Macias DOB: October 28, 1985 MRN: [id number]  Date of admission: 01/31/2020 Delivering provider: Christophe Louis   Admitting diagnosis: Antepartum bleeding, third trimester [O46.93] Intrauterine pregnancy: [redacted]w[redacted]d    Secondary diagnosis: Patient Active Problem List   Diagnosis Date Noted  . Rubella nonimmune status, delivered, current hospitalization 02/02/2020  . [place]ocyt[place] 02/02/2020  . Encounter for maternal care for vertical scar from previous cesarean delivery 02/02/2020  . Postpartum care following cesarean delivery 02/02/2020  . Status post tubal ligation 02/02/2020  . History of postpartum depression 02/02/2020  . IDA (iron deficiency anemia) 02/02/2020   Date of discharge: 02/03/2020   Discharge diagnosis: Principal Problem:   Postpartum care following cesarean delivery Active Problems:   Rubella nonimmune status, delivered, current hospitalization   [place]ocytopenia [place]   Encounter for maternal care for vertical scar from previous cesarean delivery   Status post tubal ligation   History of postpartum depression   IDA (iron deficiency anemia)                                                         Post partum procedures: None  Augmentation: N/A Pain control: Spinal;Epidural  Laceration:None  Episiotomy:None  Complications: Placental Abruption  Hospital course:  Preterm Placental Abruption With Unplanned C/S   34y.o. yo [id number] admitted with vaginal bleeding on 01/31/2020. Patient had a labor course significant for placental abruption. The patient went for cesarean section due to placental abruption. Delivery details as follows: Membrane Rupture Time/Date: 9:18 PM ,01/31/2020   Delivery Method:C-Section, Low Transverse  Details of operation can be found in separate operative note. Patient had an uncomplicated postpartum course.  She is being treated for anemia with Niferex. She is ambulating,  tolerating a regular diet, passing flatus, and urinating well.  She will receive a MMR vaccine prior to discharge for rubella non-immune status. Patient is discharged home in stable condition 02/03/20.  Newborn Data: Birth date:01/31/2020  Birth time:9:18 PM  Gender:Female  Living status:Living  Apgars:8 ,9  Weight:2160 g   Physical exam  Vitals:   02/01/20 2223 02/02/20 0453 02/02/20 2135 02/03/20 0525  BP: 117/63 125/81 122/70 112/69  Pulse: 76 71 85 76  Resp: '16 18 17 16  ' Temp: 97.7 F (36.5 C) 97.7 F (36.5 C) 98.5 F (36.9 C) 97.9 F (36.6 C)  TempSrc:  Oral Oral Oral  SpO2: 100%  100%   Weight:      Height:       General: alert, cooperative and no distress Lochia: appropriate Uterine Fundus: firm Incision: Healing well with no significant drainage Perineum: intact DVT Evaluation: No evidence of DVT seen on physical exam. Labs: Lab Results  Component Value Date   WBC 12.8 (H) 02/01/2020   HGB 8.1 (L) 02/01/2020   HCT 26.4 (L) 02/01/2020   MCV 78.1 (L) 02/01/2020   PLT 146 (L) 02/01/2020   CMP Latest Ref Rng & Units 01/14/2016  Glucose 65 - 99 mg/dL 97  BUN 6 - 20 mg/dL 15  Creatinine 0.44 - 1.00 mg/dL 0.68  Sodium 135 - 145 mmol/L 134(L)  Potassium 3.5 - 5.1 mmol/L 3.9  Chloride 101 - 111 mmol/L 106  CO2 22 - 32 mmol/L 21(L)  Calcium 8.9 - 10.3 mg/dL 8.9  Total Protein  6.0 - 8.3 g/dL -  Total Bilirubin 0.3 - 1.2 mg/dL -  Alkaline Phos 39 - 117 U/L -  AST 0 - 37 U/L -  ALT 0 - 35 U/L -   Discharge instruction:  per After Visit Summary  After Visit Meds:  Allergies as of 02/03/2020   No Known Allergies     Medication List    TAKE these medications   acetaminophen 500 MG tablet Commonly known as: TYLENOL Take 2 tablets (1,000 mg total) by mouth every 6 (six) hours.   ibuprofen 600 MG tablet Commonly known as: ADVIL Take 1 tablet (600 mg total) by mouth every 6 (six) hours.   iron polysaccharides 150 MG capsule Commonly known as: NIFEREX Take 1  capsule (150 mg total) by mouth daily. Start taking on: February 04, 2020   magnesium oxide 400 (241.3 Mg) MG tablet Commonly known as: MAG-OX Take 1 tablet (400 mg total) by mouth daily. Start taking on: February 04, 2020   oxyCODONE 5 MG immediate release tablet Commonly known as: Oxy IR/ROXICODONE Take 1 tablet (5 mg total) by mouth every 4 (four) hours as needed for moderate pain.      Diet: routine diet  Activity: Advance as tolerated. Pelvic rest for 6 weeks.   Postpartum contraception: Tubal Ligation  Newborn Data: Live born female  Birth Weight: 4 lb 12.2 oz (2160 g) APGAR: 8, 9  Newborn Delivery   Birth date/time: 01/31/2020 21:18:00 Delivery type: C-Section, Low Transverse Trial of labor: No C-section categorization: Repeat     [place] Baby Feeding: Breast Disposition:home with mother  Delivery Report:  Review the Delivery Report for details.    Follow up:  Follow-up [facility]. Schedule an appointment as soon as possible for a visit in 6 week(s).   Specialty: Obstetrics and Gynecology Contact information: [address] [place] [address] [phone]             Signed: Zettie Pho, MSN 02/03/2020, 9:17 AM

## 2020-02-03 NOTE — Progress Notes (Signed)
Pt educated on need of MMR and declined MMR vaccination .

## 2020-02-04 LAB — TYPE AND SCREEN
ABO/RH(D): O POS
Antibody Screen: NEGATIVE
Unit division: 0
Unit division: 0

## 2020-02-04 LAB — BPAM RBC
Blood Product Expiration Date: 202108032359
Blood Product Expiration Date: 202108042359
ISSUE DATE / TIME: 202107011028
Unit Type and Rh: 5100
Unit Type and Rh: 5100

## 2020-02-29 ENCOUNTER — Inpatient Hospital Stay (HOSPITAL_COMMUNITY): Admit: 2020-02-29 | Payer: BC Managed Care – PPO | Admitting: Obstetrics and Gynecology
